# Patient Record
Sex: Male | Born: 1935 | Race: White | Hispanic: No | Marital: Married | Smoking: Former smoker
Health system: Southern US, Community
[De-identification: ages and names within clinical notes are randomized; demographics above are authoritative.]

## PROBLEM LIST (undated history)

## (undated) DIAGNOSIS — N189 Chronic kidney disease, unspecified: Secondary | ICD-10-CM

## (undated) DIAGNOSIS — I10 Essential (primary) hypertension: Secondary | ICD-10-CM

## (undated) DIAGNOSIS — I2510 Atherosclerotic heart disease of native coronary artery without angina pectoris: Secondary | ICD-10-CM

## (undated) DIAGNOSIS — N400 Enlarged prostate without lower urinary tract symptoms: Secondary | ICD-10-CM

## (undated) DIAGNOSIS — E785 Hyperlipidemia, unspecified: Secondary | ICD-10-CM

## (undated) DIAGNOSIS — A35 Other tetanus: Secondary | ICD-10-CM

## (undated) DIAGNOSIS — I739 Peripheral vascular disease, unspecified: Secondary | ICD-10-CM

## (undated) HISTORY — DX: Peripheral vascular disease, unspecified: I73.9

## (undated) HISTORY — PX: CORONARY ANGIOPLASTY: SHX604

## (undated) HISTORY — DX: Benign prostatic hyperplasia without lower urinary tract symptoms: N40.0

## (undated) HISTORY — DX: Atherosclerotic heart disease of native coronary artery without angina pectoris: I25.10

## (undated) HISTORY — PX: CATARACT EXTRACTION: SUR2

## (undated) HISTORY — DX: Chronic kidney disease, unspecified: N18.9

## (undated) HISTORY — PX: PATELLA FRACTURE SURGERY: SHX735

## (undated) HISTORY — DX: Other tetanus: A35

## (undated) HISTORY — DX: Hyperlipidemia, unspecified: E78.5

---

## 2011-10-14 DIAGNOSIS — I10 Essential (primary) hypertension: Secondary | ICD-10-CM | POA: Diagnosis not present

## 2011-10-14 DIAGNOSIS — E1149 Type 2 diabetes mellitus with other diabetic neurological complication: Secondary | ICD-10-CM | POA: Diagnosis not present

## 2011-10-14 DIAGNOSIS — I739 Peripheral vascular disease, unspecified: Secondary | ICD-10-CM | POA: Diagnosis not present

## 2011-10-14 DIAGNOSIS — E1142 Type 2 diabetes mellitus with diabetic polyneuropathy: Secondary | ICD-10-CM | POA: Diagnosis not present

## 2011-10-16 DIAGNOSIS — R0989 Other specified symptoms and signs involving the circulatory and respiratory systems: Secondary | ICD-10-CM | POA: Diagnosis not present

## 2012-01-26 ENCOUNTER — Emergency Department (HOSPITAL_COMMUNITY): Payer: Medicare Other

## 2012-01-26 ENCOUNTER — Ambulatory Visit (INDEPENDENT_AMBULATORY_CARE_PROVIDER_SITE_OTHER): Payer: Medicare Other | Admitting: Emergency Medicine

## 2012-01-26 ENCOUNTER — Encounter (HOSPITAL_COMMUNITY): Payer: Self-pay | Admitting: Nurse Practitioner

## 2012-01-26 ENCOUNTER — Emergency Department (HOSPITAL_COMMUNITY)
Admission: EM | Admit: 2012-01-26 | Discharge: 2012-01-26 | Disposition: A | Discharge status: Home / Self Care | Payer: Medicare Other | Attending: Emergency Medicine | Admitting: Emergency Medicine

## 2012-01-26 VITALS — BP 97/64 | HR 69 | Resp 16 | Ht 72.5 in | Wt 185.0 lb

## 2012-01-26 DIAGNOSIS — I10 Essential (primary) hypertension: Secondary | ICD-10-CM | POA: Insufficient documentation

## 2012-01-26 DIAGNOSIS — S01119A Laceration without foreign body of unspecified eyelid and periocular area, initial encounter: Secondary | ICD-10-CM

## 2012-01-26 DIAGNOSIS — S0993XA Unspecified injury of face, initial encounter: Secondary | ICD-10-CM | POA: Diagnosis not present

## 2012-01-26 DIAGNOSIS — Y92009 Unspecified place in unspecified non-institutional (private) residence as the place of occurrence of the external cause: Secondary | ICD-10-CM | POA: Insufficient documentation

## 2012-01-26 DIAGNOSIS — S199XXA Unspecified injury of neck, initial encounter: Secondary | ICD-10-CM | POA: Diagnosis not present

## 2012-01-26 DIAGNOSIS — S0180XA Unspecified open wound of other part of head, initial encounter: Secondary | ICD-10-CM | POA: Insufficient documentation

## 2012-01-26 DIAGNOSIS — R7301 Impaired fasting glucose: Secondary | ICD-10-CM | POA: Diagnosis not present

## 2012-01-26 DIAGNOSIS — R220 Localized swelling, mass and lump, head: Secondary | ICD-10-CM | POA: Diagnosis not present

## 2012-01-26 DIAGNOSIS — R55 Syncope and collapse: Secondary | ICD-10-CM | POA: Diagnosis not present

## 2012-01-26 DIAGNOSIS — R4182 Altered mental status, unspecified: Secondary | ICD-10-CM | POA: Diagnosis not present

## 2012-01-26 DIAGNOSIS — E119 Type 2 diabetes mellitus without complications: Secondary | ICD-10-CM | POA: Insufficient documentation

## 2012-01-26 DIAGNOSIS — W19XXXA Unspecified fall, initial encounter: Secondary | ICD-10-CM | POA: Insufficient documentation

## 2012-01-26 DIAGNOSIS — IMO0002 Reserved for concepts with insufficient information to code with codable children: Secondary | ICD-10-CM

## 2012-01-26 DIAGNOSIS — E161 Other hypoglycemia: Secondary | ICD-10-CM | POA: Diagnosis not present

## 2012-01-26 DIAGNOSIS — R42 Dizziness and giddiness: Secondary | ICD-10-CM | POA: Diagnosis not present

## 2012-01-26 HISTORY — DX: Essential (primary) hypertension: I10

## 2012-01-26 LAB — COMPREHENSIVE METABOLIC PANEL
Albumin: 3.3 g/dL — ABNORMAL LOW (ref 3.5–5.2)
BUN: 13 mg/dL (ref 6–23)
Chloride: 102 mEq/L (ref 96–112)
Creatinine, Ser: 1.11 mg/dL (ref 0.50–1.35)
GFR calc Af Amer: 65 mL/min — ABNORMAL LOW (ref >90)
Glucose, Bld: 161 mg/dL — ABNORMAL HIGH (ref 70–99)
Total Bilirubin: 0.2 mg/dL — ABNORMAL LOW (ref 0.3–1.2)
Total Protein: 6.4 g/dL (ref 6.0–8.3)

## 2012-01-26 LAB — DIFFERENTIAL
Basophils Relative: 0 % (ref 0–1)
Eosinophils Absolute: 0 10*3/uL (ref 0.0–0.7)
Lymphs Abs: 0.6 10*3/uL — ABNORMAL LOW (ref 0.7–4.0)
Monocytes Absolute: 0.6 10*3/uL (ref 0.1–1.0)
Monocytes Relative: 6 % (ref 3–12)

## 2012-01-26 LAB — URINALYSIS, ROUTINE W REFLEX MICROSCOPIC
Glucose, UA: NEGATIVE mg/dL
Ketones, ur: NEGATIVE mg/dL
Leukocytes, UA: NEGATIVE
Nitrite: NEGATIVE
Protein, ur: NEGATIVE mg/dL
Urobilinogen, UA: 0.2 mg/dL (ref 0.0–1.0)

## 2012-01-26 LAB — POCT I-STAT TROPONIN I: Troponin i, poc: 0 ng/mL (ref 0.00–0.08)

## 2012-01-26 LAB — CBC
HCT: 42.2 % (ref 39.0–52.0)
Hemoglobin: 14.5 g/dL (ref 13.0–17.0)
MCH: 33.6 pg (ref 26.0–34.0)
MCHC: 34.4 g/dL (ref 30.0–36.0)
RBC: 4.32 MIL/uL (ref 4.22–5.81)

## 2012-01-26 LAB — GLUCOSE, POCT (MANUAL RESULT ENTRY): POC Glucose: 172

## 2012-01-26 NOTE — ED Notes (Signed)
Pt undressed, in gown, on monitor, continuous pulse oximetry, blood pressure cuff and oxygen [place] (2L); EKG performed 

## 2012-01-26 NOTE — ED Notes (Signed)
Pt has returned from being off the floor; pt placed back on monitor, continuous pulse oximetry, blood pressure cuff and oxygen [place] (2L); family at bedside; phlebotomist at bedside drawing blood at this time

## 2012-01-26 NOTE — ED Notes (Signed)
Pt. States he became dizzy today and fell on the floor hitting his head. Unsure if loss of consciousness. States he was then nauseous and weak. Currently only complaint is weakness. Denies any pain. Radial and pedal Pulses palpable.

## 2012-01-26 NOTE — Discharge Instructions (Signed)
Have the sutures removed in 3-5 days  Near-Syncope Near-syncope is sudden weakness, dizziness, or feeling like you might pass out (faint). This may occur when getting up after sitting or while standing for a long period of time. Near-syncope can be caused by a drop in blood pressure. This is a common reaction, but it may occur to a greater degree in people taking medicines to control their blood pressure. Fainting often occurs when the blood pressure or pulse is too low to provide enough blood flow to the brain to keep you conscious. Fainting and near-syncope are not usually due to serious medical problems. However, certain people should be more cautious in the event of near-syncope, including elderly patients, patients with diabetes, and patients with a history of heart conditions (especially irregular rhythms).  CAUSES   Drop in blood pressure.   Physical pain.   Dehydration.   Heat exhaustion.   Emotional distress.   Low blood sugar.   Internal bleeding.   Heart and circulatory problems.   Infections.  SYMPTOMS   Dizziness.   Feeling sick to your stomach (nauseous).   Nearly fainting.   Body numbness.   Turning pale.   Tunnel vision.   Weakness.  HOME CARE INSTRUCTIONS   Lie down right away if you start feeling like you might faint. Breathe deeply and steadily. Wait until all the symptoms have passed. Most of these episodes last only a few minutes. You may feel tired for several hours.   Drink enough fluids to keep your urine clear or pale yellow.   If you are taking blood pressure or heart medicine, get up slowly, taking several minutes to sit and then stand. This can reduce dizziness that is caused by a drop in blood pressure.  SEEK IMMEDIATE MEDICAL CARE IF:   You have a severe headache.   Unusual pain develops in the chest, abdomen, or back.   There is bleeding from the mouth or rectum, or you have black or tarry stool.   An irregular heartbeat or a very  rapid pulse develops.   You have repeated fainting or seizure-like jerking during an episode.   You faint when sitting or lying down.   You develop confusion.   You have difficulty walking.   Severe weakness develops.   Vision problems develop.  MAKE SURE YOU:   Understand these instructions.   Will watch your condition.   Will get help right away if you are not doing well or get worse.  Document Released: 09/02/2005 Document Revised: 08/22/2011 Document Reviewed: 10/19/2010 [facility] Patient Information 2012 [place], [place].Laceration Care, Adult A laceration is a cut or lesion that goes through all layers of the skin and into the tissue just beneath the skin. TREATMENT  Some lacerations may not require closure. Some lacerations may not be able to be closed due to an increased risk of infection. It is important to see your caregiver as soon as possible after an injury to minimize the risk of infection and maximize the opportunity for successful closure. If closure is appropriate, pain medicines may be given, if needed. The wound will be cleaned to help prevent infection. Your caregiver will use stitches (sutures), staples, wound glue (adhesive), or skin adhesive strips to repair the laceration. These tools bring the skin edges together to allow for faster healing and a better cosmetic outcome. However, all wounds will heal with a scar. Once the wound has healed, scarring can be minimized by covering the wound with sunscreen during the day  for 1 full year. HOME CARE INSTRUCTIONS  For sutures or staples:  Keep the wound clean and dry.   If you were given a bandage (dressing), you should change it at least once a day. Also, change the dressing if it becomes wet or dirty, or as directed by your caregiver.   Wash the wound with soap and water 2 times a day. Rinse the wound off with water to remove all soap. Pat the wound dry with a clean towel.   After cleaning, apply a thin layer of  the antibiotic ointment as recommended by your caregiver. This will help prevent infection and keep the dressing from sticking.   You may shower as usual after the first 24 hours. Do not soak the wound in water until the sutures are removed.   Only take over-the-counter or prescription medicines for pain, discomfort, or fever as directed by your caregiver.   Get your sutures or staples removed as directed by your caregiver.  For skin adhesive strips:  Keep the wound clean and dry.   Do not get the skin adhesive strips wet. You may bathe carefully, using caution to keep the wound dry.   If the wound gets wet, pat it dry with a clean towel.   Skin adhesive strips will fall off on their own. You may trim the strips as the wound heals. Do not remove skin adhesive strips that are still stuck to the wound. They will fall off in time.  For wound adhesive:  You may briefly wet your wound in the shower or bath. Do not soak or scrub the wound. Do not swim. Avoid periods of heavy perspiration until the skin adhesive has fallen off on its own. After showering or bathing, gently pat the wound dry with a clean towel.   Do not apply liquid medicine, cream medicine, or ointment medicine to your wound while the skin adhesive is in place. This may loosen the film before your wound is healed.   If a dressing is placed over the wound, be careful not to apply tape directly over the skin adhesive. This may cause the adhesive to be pulled off before the wound is healed.   Avoid prolonged exposure to sunlight or tanning lamps while the skin adhesive is in place. Exposure to ultraviolet light in the first year will darken the scar.   The skin adhesive will usually remain in place for 5 to 10 days, then naturally fall off the skin. Do not pick at the adhesive film.  You may need a tetanus shot if:  You cannot remember when you had your last tetanus shot.   You have never had a tetanus shot.  If you get a  tetanus shot, your arm may swell, get red, and feel warm to the touch. This is common and not a problem. If you need a tetanus shot and you choose not to have one, there is a rare chance of getting tetanus. Sickness from tetanus can be serious. SEEK MEDICAL CARE IF:   You have redness, swelling, or increasing pain in the wound.   You see a red line that goes away from the wound.   You have yellowish-white fluid (pus) coming from the wound.   You have a fever.   You notice a bad smell coming from the wound or dressing.   Your wound breaks open before or after sutures have been removed.   You notice something coming out of the wound such as wood or  glass.   Your wound is on your hand or foot and you cannot move a finger or toe.  SEEK IMMEDIATE MEDICAL CARE IF:   Your pain is not controlled with prescribed medicine.   You have severe swelling around the wound causing pain and numbness or a change in color in your arm, hand, leg, or foot.   Your wound splits open and starts bleeding.   You have worsening numbness, weakness, or loss of function of any joint around or beyond the wound.   You develop painful lumps near the wound or on the skin anywhere on your body.  MAKE SURE YOU:   Understand these instructions.   Will watch your condition.   Will get help right away if you are not doing well or get worse.  Document Released: 09/02/2005 Document Revised: 08/22/2011 Document Reviewed: 02/26/2011 [facility] Patient Information 2012 [place], [place].

## 2012-01-26 NOTE — ED Notes (Signed)
Patient transported to X-ray 

## 2012-01-26 NOTE — Progress Notes (Signed)
  Subjective:    Patient ID: Andrew Weaver, male    DOB: [birth year], 78 y.o.   MRN: [id number]  HPI patient presents with an episode of near syncope. Apparently he got up to eat breakfast and had a near syncopal episode in the kitchen. He fell to the ground striking the area above his left. He feels extremely weak and nauseated. He describes some mild substernal discomfort on arrival here the patient was still complaining of nausea. He still continued to feel lightheaded. He is an insulin-dependent diabetic and also hypertensive patient has had stents in his heart and abdominal stent    Review of Systems     Objective:   Physical Exam on exam patient is lying supine on the exam table. There is a small laceration above the left. There are no focal cranial nerve signs. He is alert and oriented he has good strength in upper and lower extremities. Exam revealed an irregular rhythm no murmurs. His lungs were clear with pulse ox of 98  EKG showed what appears to be intermittent sinus intermittent nodal rhythm slow rate not below 50 with one PVC seen      Assessment & Plan:  Patient with an episode of near syncope head trauma with persistent nausea and history of cardiac disease. He was started on O2 . patient placed on monitor EMS called for evaluation in the emergency room

## 2012-01-26 NOTE — ED Notes (Signed)
Per ems: pt from assisted living, had a syncopal episode today falling from standing position landing face first onto floor. Wife drove to urgent care. Urgent care called ems transport for hypotension and bradycardia at rate 58. ems found lac to L eyelid with no active bleeding, pt A&Ox4, VSS en route. Urgent care gave 500 cc bolus. MAE.

## 2012-01-26 NOTE — ED Notes (Signed)
Pt given a Happy meal with a cup of ice water to drink

## 2012-01-26 NOTE — ED Provider Notes (Signed)
History     CSN: [id number]  Arrival date & time 01/26/12  1111   First MD Initiated Contact with Patient 01/26/12 1119      Chief Complaint  Patient presents with  . Loss of Consciousness    (Consider location/radiation/quality/duration/timing/severity/associated sxs/prior treatment) Patient is a 78 y.o. male presenting with syncope. The history is provided by the patient and the spouse.  Loss of Consciousness   patient here after having an unwitnessed syncopal event at home. States she was walking to the kitchen to have some food, took his blood pressure should medication and then had a brief syncopal event. Denied any headaches, chest pain, abdominal pain, dyspnea prior to the event. No postictal period. Complains sharp pain to his face. Denies any neck pain or upper or lower extremity paresthesias. Went to urgent care and was noted to have been hypotensive and bradycardic with a heart rate of 58. Was given saline bolus of a half a liter and transported here. He denies any dizziness or recent black or bloody stools. No prior history of this  Past Medical History  Diagnosis Date  . Diabetes mellitus   . Hypertension     History reviewed. No pertinent past surgical history.  History reviewed. No pertinent family history.  History  Substance Use Topics  . Smoking status: Former Smoker    Quit date: 09/17/1991  . Smokeless tobacco: Never Used  . Alcohol Use: Yes     7      Review of Systems  Cardiovascular: Positive for syncope.  All other systems reviewed and are negative.    Allergies  Review of patient's allergies indicates no known allergies.  Home Medications  No current outpatient prescriptions on file.  BP 125/80  Pulse 64  Temp(Src) 97.5 F (36.4 C) (Oral)  Resp 20  SpO2 99%  Physical Exam  Nursing note and vitals reviewed. Constitutional: He is oriented to person, place, and time. He appears well-developed and well-nourished.  Non-toxic  appearance. No distress.  HENT:  Head: Normocephalic. Head is with abrasion, with contusion and with laceration.  Eyes: Conjunctivae, EOM and lids are normal. Pupils are equal, round, and reactive to light.  Neck: Normal range of motion. Neck supple. No tracheal deviation present. No mass present.  Cardiovascular: Normal rate, regular rhythm and normal heart sounds.  Exam reveals no gallop.   No murmur heard. Pulmonary/Chest: Effort normal and breath sounds normal. No stridor. No respiratory distress. He has no decreased breath sounds. He has no wheezes. He has no rhonchi. He has no rales.  Abdominal: Soft. Normal appearance and bowel sounds are normal. He exhibits no distension. There is no tenderness. There is no rebound and no CVA tenderness.  Musculoskeletal: Normal range of motion. He exhibits no edema and no tenderness.  Neurological: He is alert and oriented to person, place, and time. He has normal strength. No cranial nerve deficit or sensory deficit. GCS eye subscore is 4. GCS verbal subscore is 5. GCS motor subscore is 6.  Skin: Skin is warm and dry. No abrasion and no rash noted.  Psychiatric: He has a normal mood and affect. His speech is normal and behavior is normal.    ED Course  Procedures (including critical care time)   Labs Reviewed  CBC  DIFFERENTIAL  COMPREHENSIVE METABOLIC PANEL  URINALYSIS, ROUTINE W REFLEX MICROSCOPIC  URINE CULTURE   No results found.   No diagnosis found.    MDM  The patient had negative orthostatics x2 here. He  was able to stand on his own. Blood work and x-rays noted. His bradycardia is chronic. Suspect that patient's transient hypotension due 2 his blood pressure medication.. Laceration repaired by my physician assistant. According to the family patient `pt is at his baseline and now stable for d/c         Toy Baker, MD 01/26/12 [phone]

## 2012-01-26 NOTE — ED Notes (Signed)
X-ray tech at bedside to take pt to x-ray

## 2012-01-26 NOTE — ED Provider Notes (Signed)
I performed the laceration repair only for this patient. Please see Dr. Eliot Ford note for complete documentation.  LACERATION REPAIR Performed by: Grant Fontana Authorized by: Grant Fontana Consent: Verbal consent obtained. Risks and benefits: risks, benefits and alternatives were discussed Consent given by: patient Patient identity confirmed: provided demographic data Prepped and Draped in normal sterile fashion Wound explored  Laceration Location: L eyebrow  Laceration Length: 1.5cm  No Foreign Bodies seen or palpated  Anesthesia: local infiltration  Local anesthetic: lidocaine 2% without epinephrine  Anesthetic total: 1 ml  Irrigation method: syringe Amount of cleaning: standard  Skin closure: 6-0 Prolene  Number of sutures: 3  Technique: simple interrupted  Patient tolerance: Patient tolerated the procedure well with no immediate complications.   Grant Fontana, [place] 01/26/12 1523

## 2012-01-28 LAB — URINE CULTURE: Colony Count: NO GROWTH

## 2012-01-28 NOTE — ED Provider Notes (Signed)
Medical screening examination/treatment/procedure(s) were conducted as a shared visit with non-physician practitioner(s) and myself.  I personally evaluated the patient during the encounter  Toy Baker, MD 01/28/12 1512

## 2012-01-29 ENCOUNTER — Encounter: Payer: Self-pay | Admitting: Emergency Medicine

## 2012-01-31 DIAGNOSIS — R55 Syncope and collapse: Secondary | ICD-10-CM | POA: Diagnosis not present

## 2012-01-31 DIAGNOSIS — E7800 Pure hypercholesterolemia, unspecified: Secondary | ICD-10-CM | POA: Diagnosis not present

## 2012-01-31 DIAGNOSIS — I2510 Atherosclerotic heart disease of native coronary artery without angina pectoris: Secondary | ICD-10-CM | POA: Diagnosis not present

## 2012-01-31 DIAGNOSIS — I10 Essential (primary) hypertension: Secondary | ICD-10-CM | POA: Diagnosis not present

## 2012-03-24 DIAGNOSIS — Z1331 Encounter for screening for depression: Secondary | ICD-10-CM | POA: Diagnosis not present

## 2012-03-24 DIAGNOSIS — Z0000 Encounter for general adult medical examination without abnormal findings: Secondary | ICD-10-CM | POA: Diagnosis not present

## 2012-03-24 DIAGNOSIS — I10 Essential (primary) hypertension: Secondary | ICD-10-CM | POA: Diagnosis not present

## 2012-03-24 DIAGNOSIS — Z79899 Other long term (current) drug therapy: Secondary | ICD-10-CM | POA: Diagnosis not present

## 2012-03-24 DIAGNOSIS — E1149 Type 2 diabetes mellitus with other diabetic neurological complication: Secondary | ICD-10-CM | POA: Diagnosis not present

## 2012-03-24 DIAGNOSIS — E7800 Pure hypercholesterolemia, unspecified: Secondary | ICD-10-CM | POA: Diagnosis not present

## 2012-03-24 DIAGNOSIS — E1142 Type 2 diabetes mellitus with diabetic polyneuropathy: Secondary | ICD-10-CM | POA: Diagnosis not present

## 2012-03-24 DIAGNOSIS — K5900 Constipation, unspecified: Secondary | ICD-10-CM | POA: Diagnosis not present

## 2012-06-17 DIAGNOSIS — Z23 Encounter for immunization: Secondary | ICD-10-CM | POA: Diagnosis not present

## 2012-08-10 DIAGNOSIS — I10 Essential (primary) hypertension: Secondary | ICD-10-CM | POA: Diagnosis not present

## 2012-08-10 DIAGNOSIS — R0989 Other specified symptoms and signs involving the circulatory and respiratory systems: Secondary | ICD-10-CM | POA: Diagnosis not present

## 2012-08-10 DIAGNOSIS — I2510 Atherosclerotic heart disease of native coronary artery without angina pectoris: Secondary | ICD-10-CM | POA: Diagnosis not present

## 2012-08-10 DIAGNOSIS — E7800 Pure hypercholesterolemia, unspecified: Secondary | ICD-10-CM | POA: Diagnosis not present

## 2012-08-17 DIAGNOSIS — D485 Neoplasm of uncertain behavior of skin: Secondary | ICD-10-CM | POA: Diagnosis not present

## 2012-08-17 DIAGNOSIS — L570 Actinic keratosis: Secondary | ICD-10-CM | POA: Diagnosis not present

## 2012-09-11 DIAGNOSIS — R0989 Other specified symptoms and signs involving the circulatory and respiratory systems: Secondary | ICD-10-CM | POA: Diagnosis not present

## 2012-09-25 DIAGNOSIS — E7800 Pure hypercholesterolemia, unspecified: Secondary | ICD-10-CM | POA: Diagnosis not present

## 2012-09-25 DIAGNOSIS — E1149 Type 2 diabetes mellitus with other diabetic neurological complication: Secondary | ICD-10-CM | POA: Diagnosis not present

## 2012-09-25 DIAGNOSIS — I10 Essential (primary) hypertension: Secondary | ICD-10-CM | POA: Diagnosis not present

## 2012-09-25 DIAGNOSIS — Z79899 Other long term (current) drug therapy: Secondary | ICD-10-CM | POA: Diagnosis not present

## 2012-09-25 DIAGNOSIS — E782 Mixed hyperlipidemia: Secondary | ICD-10-CM | POA: Diagnosis not present

## 2012-09-25 DIAGNOSIS — R05 Cough: Secondary | ICD-10-CM | POA: Diagnosis not present

## 2012-09-25 DIAGNOSIS — E1142 Type 2 diabetes mellitus with diabetic polyneuropathy: Secondary | ICD-10-CM | POA: Diagnosis not present

## 2012-09-30 DIAGNOSIS — Z961 Presence of intraocular lens: Secondary | ICD-10-CM | POA: Diagnosis not present

## 2013-03-26 DIAGNOSIS — Z79899 Other long term (current) drug therapy: Secondary | ICD-10-CM | POA: Diagnosis not present

## 2013-03-26 DIAGNOSIS — Z0000 Encounter for general adult medical examination without abnormal findings: Secondary | ICD-10-CM | POA: Diagnosis not present

## 2013-03-26 DIAGNOSIS — E1149 Type 2 diabetes mellitus with other diabetic neurological complication: Secondary | ICD-10-CM | POA: Diagnosis not present

## 2013-03-26 DIAGNOSIS — Z1331 Encounter for screening for depression: Secondary | ICD-10-CM | POA: Diagnosis not present

## 2013-03-26 DIAGNOSIS — I10 Essential (primary) hypertension: Secondary | ICD-10-CM | POA: Diagnosis not present

## 2013-03-26 DIAGNOSIS — E1142 Type 2 diabetes mellitus with diabetic polyneuropathy: Secondary | ICD-10-CM | POA: Diagnosis not present

## 2013-03-26 DIAGNOSIS — E7800 Pure hypercholesterolemia, unspecified: Secondary | ICD-10-CM | POA: Diagnosis not present

## 2013-04-22 DIAGNOSIS — L570 Actinic keratosis: Secondary | ICD-10-CM | POA: Diagnosis not present

## 2013-04-22 DIAGNOSIS — L821 Other seborrheic keratosis: Secondary | ICD-10-CM | POA: Diagnosis not present

## 2013-04-29 DIAGNOSIS — L570 Actinic keratosis: Secondary | ICD-10-CM | POA: Diagnosis not present

## 2013-05-07 DIAGNOSIS — I10 Essential (primary) hypertension: Secondary | ICD-10-CM | POA: Diagnosis not present

## 2013-05-07 DIAGNOSIS — G459 Transient cerebral ischemic attack, unspecified: Secondary | ICD-10-CM | POA: Diagnosis not present

## 2013-05-10 DIAGNOSIS — I10 Essential (primary) hypertension: Secondary | ICD-10-CM | POA: Diagnosis not present

## 2013-05-10 DIAGNOSIS — I2510 Atherosclerotic heart disease of native coronary artery without angina pectoris: Secondary | ICD-10-CM | POA: Diagnosis not present

## 2013-05-10 DIAGNOSIS — G459 Transient cerebral ischemic attack, unspecified: Secondary | ICD-10-CM | POA: Diagnosis not present

## 2013-05-10 DIAGNOSIS — E7800 Pure hypercholesterolemia, unspecified: Secondary | ICD-10-CM | POA: Diagnosis not present

## 2013-07-01 DIAGNOSIS — L821 Other seborrheic keratosis: Secondary | ICD-10-CM | POA: Diagnosis not present

## 2013-07-18 ENCOUNTER — Ambulatory Visit (INDEPENDENT_AMBULATORY_CARE_PROVIDER_SITE_OTHER): Payer: Medicare Other | Admitting: Family Medicine

## 2013-07-18 ENCOUNTER — Ambulatory Visit: Payer: Medicare Other

## 2013-07-18 VITALS — BP 128/62 | HR 89 | Temp 97.5°F | Resp 18 | Ht 72.0 in | Wt 182.0 lb

## 2013-07-18 DIAGNOSIS — J209 Acute bronchitis, unspecified: Secondary | ICD-10-CM

## 2013-07-18 LAB — POCT CBC
HCT, POC: 43.3 % — AB (ref 43.5–53.7)
Hemoglobin: 13.7 g/dL — AB (ref 14.1–18.1)
Lymph, poc: 1.2 (ref 0.6–3.4)
MCH, POC: 32.7 pg — AB (ref 27–31.2)
MCHC: 31.6 g/dL — AB (ref 31.8–35.4)
MCV: 103.4 fL — AB (ref 80–97)
POC MID %: 6.9 %M (ref 0–12)
RBC: 4.19 M/uL — AB (ref 4.69–6.13)
WBC: 6.8 10*3/uL (ref 4.6–10.2)

## 2013-07-18 MED ORDER — AZITHROMYCIN 250 MG PO TABS: ORAL_TABLET | ORAL | Status: DC

## 2013-07-18 MED ORDER — GUAIFENESIN-CODEINE 100-10 MG/5ML PO SOLN: 5.0000 mL | Freq: Four times a day (QID) | ORAL | Status: DC | PRN

## 2013-07-18 NOTE — Patient Instructions (Signed)

## 2013-07-18 NOTE — Progress Notes (Signed)
This chart was scribed for Andrew Sorenson, MD by Ardelia Mems, Scribe. This patient was seen in room 8 and the patient's care was started at 2:32 PM.  Subjective:    Patient ID: Andrew Weaver, male    DOB: 03-Mar-1919, 79 y.o.   MRN: [id number]  Chief Complaint  Patient presents with  . Bronchitis    * 4 days  . Fever    101 in the evenings   HPI  HPI Comments: Andrew Weaver is a 79 y.o. male who presents to [facility] complaining of a cough for the past 4 days. He states that he has not been able to produce any sputum with coughing, and states that his chest feels congested. He also reports an associated fever of 101 F present intermittently over the past 4 days. He also states that he has not been able to sleep much the past 4 nights, due to his symptoms. He states that he has taken Mucinex without relief of symptoms. He has a history of DM and states that his blood sugars have not been significantly elevated at home, and have not went over the 120s.  He denies headaches, ear pain, chest pain, SOB or wheezing. Denies nasal congestion. Had a little sore throat initially but now mainly has laryngitis. States he caught this illness from his wife who will be in tomorrow for recheck.   Past Medical History  Diagnosis Date  . Diabetes mellitus   . Hypertension    Current Outpatient Prescriptions on File Prior to Visit  Medication Sig Dispense Refill  . insulin glargine (LANTUS) 100 UNIT/ML injection Inject 22 Units into the skin daily.      . insulin regular (NOVOLIN R,HUMULIN R) 100 units/mL injection Inject 5-10 Units into the skin 3 (three) times daily before meals. 5 units if blood sugar under 150, 10 units if over 200 - estimates in between 150 and 200      . rosuvastatin (CRESTOR) 20 MG tablet Take 20 mg by mouth at bedtime.      Marland Kitchen FOLIC ACID PO Take 1 tablet by mouth daily.       No current facility-administered medications on file prior to visit.   No Known  Allergies   Review of Systems  Constitutional: Positive for fever.  HENT: Positive for congestion. Negative for ear pain.   Respiratory: Positive for cough. Negative for shortness of breath and wheezing.   Cardiovascular: Negative for chest pain.  Neurological: Negative for headaches.  Psychiatric/Behavioral: Positive for sleep disturbance.      BP 128/62  Pulse 89  Temp(Src) 97.5 F (36.4 C) (Oral)  Resp 18  Ht 6' (1.829 m)  Wt 182 lb (82.555 kg)  BMI 24.68 kg/m2  SpO2 96% Objective:   Physical Exam  Nursing note and vitals reviewed. Constitutional: He is oriented to person, place, and time. He appears well-developed and well-nourished. No distress.  HENT:  Head: Normocephalic and atraumatic.  Right Ear: Tympanic membrane is injected and retracted. A middle ear effusion is present.  Left Ear: Tympanic membrane is injected and retracted. A middle ear effusion is present.  Nose: No mucosal edema or rhinorrhea.  Mouth/Throat: Uvula is midline and mucous membranes are normal. Posterior oropharyngeal erythema present. No oropharyngeal exudate or posterior oropharyngeal edema.  Eyes: EOM are normal.  Neck: Neck supple. No tracheal deviation present.  Cardiovascular: Normal rate.   Pulmonary/Chest: Effort normal. No respiratory distress.  Musculoskeletal: Normal range of motion.  Lymphadenopathy:  Head (right side): Tonsillar adenopathy present.       Head (left side): Tonsillar adenopathy present.  Neurological: He is alert and oriented to person, place, and time.  Skin: Skin is warm and dry.  Psychiatric: He has a normal mood and affect. His behavior is normal.    Results for orders placed in visit on 07/18/13  POCT CBC      Result Value Range   WBC 6.8  4.6 - 10.2 K/uL   Lymph, poc 1.2  0.6 - 3.4   POC LYMPH PERCENT 17.3  10 - 50 %L   MID (cbc) 0.5  0 - 0.9   POC MID % 6.9  0 - 12 %M   POC Granulocyte 5.2  2 - 6.9   Granulocyte percent 75.8  37 - 80 %G   RBC  4.19 (*) 4.69 - 6.13 M/uL   Hemoglobin 13.7 (*) 14.1 - 18.1 g/dL   HCT, POC 16.1 (*) 09.6 - 53.7 %   MCV 103.4 (*) 80 - 97 fL   MCH, POC 32.7 (*) 27 - 31.2 pg   MCHC 31.6 (*) 31.8 - 35.4 g/dL   RDW, POC 04.5     Platelet Count, POC 189  142 - 424 K/uL   MPV 11.1  0 - 99.8 fL    Primary X-Ray Reading by Dr. Clelia Croft: No acute abnormality noted EKG: NSR, no ischemic changes; no acute abnormality    Assessment & Plan:   Acute bronchitis - Plan: POCT CBC, DG Chest 2 View, EKG 12-Lead  Meds ordered this encounter  Medications  . losartan-hydrochlorothiazide (HYZAAR) 100-25 MG per tablet    Sig: Take 1 tablet by mouth daily.  . ticlopidine (TICLID) 250 MG tablet    Sig: Take 250 mg by mouth daily.  Marland Kitchen guaiFENesin-codeine 100-10 MG/5ML syrup    Sig: Take 5 mLs by mouth 4 (four) times daily as needed for cough.    Dispense:  180 mL    Refill:  0  . azithromycin (ZITHROMAX) 250 MG tablet    Sig: Take 2 tabs PO x 1 dose, then 1 tab PO QD x 4 days    Dispense:  6 tablet    Refill:  0    I personally performed the services described in this documentation, which was scribed in my presence. The recorded information has been reviewed and considered, and addended by me as needed.  Andrew Sorenson, MD MPH

## 2013-07-22 DIAGNOSIS — J40 Bronchitis, not specified as acute or chronic: Secondary | ICD-10-CM | POA: Diagnosis not present

## 2013-08-27 DIAGNOSIS — E1149 Type 2 diabetes mellitus with other diabetic neurological complication: Secondary | ICD-10-CM | POA: Diagnosis not present

## 2013-08-27 DIAGNOSIS — E1142 Type 2 diabetes mellitus with diabetic polyneuropathy: Secondary | ICD-10-CM | POA: Diagnosis not present

## 2013-08-27 DIAGNOSIS — I10 Essential (primary) hypertension: Secondary | ICD-10-CM | POA: Diagnosis not present

## 2013-08-27 DIAGNOSIS — K5900 Constipation, unspecified: Secondary | ICD-10-CM | POA: Diagnosis not present

## 2013-09-01 DIAGNOSIS — E119 Type 2 diabetes mellitus without complications: Secondary | ICD-10-CM | POA: Diagnosis not present

## 2013-09-01 DIAGNOSIS — Z961 Presence of intraocular lens: Secondary | ICD-10-CM | POA: Diagnosis not present

## 2013-09-01 DIAGNOSIS — H52209 Unspecified astigmatism, unspecified eye: Secondary | ICD-10-CM | POA: Diagnosis not present

## 2013-09-01 DIAGNOSIS — H524 Presbyopia: Secondary | ICD-10-CM | POA: Diagnosis not present

## 2013-11-06 DIAGNOSIS — I10 Essential (primary) hypertension: Secondary | ICD-10-CM | POA: Insufficient documentation

## 2013-11-10 ENCOUNTER — Encounter (INDEPENDENT_AMBULATORY_CARE_PROVIDER_SITE_OTHER): Payer: Self-pay

## 2013-11-10 ENCOUNTER — Encounter: Payer: Self-pay | Admitting: Interventional Cardiology

## 2013-11-10 ENCOUNTER — Encounter: Payer: Self-pay | Admitting: Cardiology

## 2013-11-10 ENCOUNTER — Ambulatory Visit (INDEPENDENT_AMBULATORY_CARE_PROVIDER_SITE_OTHER): Payer: Medicare Other | Admitting: Interventional Cardiology

## 2013-11-10 VITALS — BP 132/56 | HR 58 | Ht 72.0 in | Wt 184.0 lb

## 2013-11-10 DIAGNOSIS — I10 Essential (primary) hypertension: Secondary | ICD-10-CM | POA: Diagnosis not present

## 2013-11-10 DIAGNOSIS — E1149 Type 2 diabetes mellitus with other diabetic neurological complication: Secondary | ICD-10-CM | POA: Insufficient documentation

## 2013-11-10 DIAGNOSIS — E782 Mixed hyperlipidemia: Secondary | ICD-10-CM

## 2013-11-10 DIAGNOSIS — I2510 Atherosclerotic heart disease of native coronary artery without angina pectoris: Secondary | ICD-10-CM

## 2013-11-10 DIAGNOSIS — E1142 Type 2 diabetes mellitus with diabetic polyneuropathy: Secondary | ICD-10-CM

## 2013-11-10 NOTE — Progress Notes (Signed)
Patient ID: Andrew Weaver, male   DOB: [birth year], 80 y.o.   MRN: [id number]    [place], [address] [place], [place]  [address] Phone: [phone] Fax:  [phone]  Date:  11/10/2013   ID:  Andrew Weaver, DOB [birth year], MRN [id number]  PCP:  Robyn Haber, MD      History of Present Illness: Andrew Weaver is a 80 y.o. male who has 2 stents in the LAD several years ago, out of town. He feels that his balance is not good. He has not been walking. He feels fatigued generally. IN early 2013, he had a syncopal episode and had a cut over his left eye requiring stitches. He had an episode of slurred speech and was started on aspirin. His losartan was decreased in half due to dizziness. BP was controlled at home. He had taken one dose of hydrocodone cough syrup. The day after, he passed out. He has not had any further syncopal spells. CAD/ASCVD:  BP has been controlled. No systolics below 485. Denies : Chest pain.  Diaphoresis.  Dizziness.  Dyspnea on exertion.  Leg edema.  Nitroglycerin.  Palpitations.  Paroxysmal nocturnal dyspnea.  Syncope none recently.     Wt Readings from Last 3 Encounters:  11/10/13 184 lb (83.462 kg)  07/18/13 182 lb (82.555 kg)  01/26/12 185 lb (83.915 kg)     Past Medical History  Diagnosis Date  . Diabetes mellitus   . Hypertension   . Hyperlipidemia     goal LDL less than 70  . CAD (coronary artery disease)   . PVD (peripheral vascular disease)   . BPH (benign prostatic hypertrophy)   . Chronic kidney disease     borderline stage II/stage III    Current Outpatient Prescriptions  Medication Sig Dispense Refill  . clopidogrel (PLAVIX) 75 MG tablet Take 75 mg by mouth once.       . Fluticasone-Salmeterol (ADVAIR DISKUS) 500-50 MCG/DOSE AEPB Inhale 1 puff into the lungs 2 (two) times daily.      Marland Kitchen FOLIC ACID PO Take 1 tablet by mouth daily.      Marland Kitchen guaiFENesin-codeine 100-10 MG/5ML syrup Take 5 mLs by mouth 4 (four) times daily  as needed for cough.  180 mL  0  . insulin glargine (LANTUS) 100 UNIT/ML injection Inject 22 Units into the skin daily.      . insulin regular (NOVOLIN R,HUMULIN R) 100 units/mL injection Inject 5-10 Units into the skin 3 (three) times daily before meals. 5 units if blood sugar under 150, 10 units if over 200 - estimates in between 150 and 200      . losartan-hydrochlorothiazide (HYZAAR) 100-25 MG per tablet Take 1 tablet by mouth daily.      . mirtazapine (REMERON) 15 MG tablet       . PREVIDENT 5000 BOOSTER PLUS 1.1 % PSTE       . rosuvastatin (CRESTOR) 20 MG tablet Take 10 mg by mouth at bedtime.       . tamsulosin (FLOMAX) 0.4 MG CAPS capsule Take 0.4 mg by mouth daily.        No current facility-administered medications for this visit.    Allergies:    Allergies  Allergen Reactions  . Lipitor [Atorvastatin]     fatigue    Social History:  The patient  reports that he quit smoking about 22 years ago. He has never used smokeless tobacco. He reports that he drinks alcohol. He reports that he does not use  illicit drugs.   Family History:  The patient's family history includes Cancer in his father; Heart disease in his father, mother, and sister.   ROS:  Please see the history of present illness.  No nausea, vomiting.  No fevers, chills.  No focal weakness.  No dysuria.    All other systems reviewed and negative.   PHYSICAL EXAM: VS:  BP 132/56  Pulse 58  Ht 6' (1.829 m)  Wt 184 lb (83.462 kg)  BMI 24.95 kg/m2 Well nourished, well developed, in no acute distress HEENT: normal Neck: no JVD, no carotid bruits Cardiac:  normal S1, S2; RRR;  Lungs:  clear to auscultation bilaterally, no wheezing, rhonchi or rales Abd: soft, nontender, no hepatomegaly Ext: no edema Skin: warm and dry Neuro:   no focal abnormalities noted    ASSESSMENT AND PLAN:  CAD  Continue Clopidogrel Bisulfate Tablet, 75 MG, 1 tablet, Orally, Once a day, 30 day(s), 30, Refills 11 Notes: No angina. No  aspirin for many years. Not allergic to aspirin, he was just never taking it.  Diet is not great.  He outs out 4 days/week, due to the food being bad at [facility], in his opinion.    2. Hypercholesterolemia  Continue Crestor Tablet, 20 MG, 1 tablet, Orally, Once a day Notes: LDL 58 in 09/2012. TG elevated to 216 in the past. Control diet. He does not enjoy the food at [facility]. Lipids reviewed.   3. Hypertension  Continue Losartan Potassium-HCTZ Tablet, 100-25 MG, half tablet, Orally, Once a day IMAGING: EKG    Overton,Shana 05/10/2013 10:01:33 AM > Shandria Clinch,JAY 05/10/2013 10:19:23 AM > NSR, ST-T wave changes   Notes: Controlled. No elevated readings even on half tab of losartan/HCTZ. If BP increases, would have to go back to full tablet daily. Target BP < 140/90. .    4. TIA  Notes: He is not sure if his speech was really slurred. Carotid DOppler in 12/13 was reviewed. No significant carotid disease. No bleeding problems since adding aspirin back.   No further issues like the prior event noticed by his wife over a year.     5. Others  Notes: No syncope since decrease in BP med.    Preventive Medicine  Adult topics discussed:  Diet: healthy diet, low calorie, low fat.  Exercise: 5 days a week, at least 30 minutes of aerobic exercise, preferably on the exercise bike.     Signed, Mina Marble, MD, [facility] 11/10/2013 10:49 AM

## 2013-11-10 NOTE — Patient Instructions (Signed)
Your physician recommends that you continue on your current medications as directed. Please refer to the Current Medication list given to you today.  Your physician wants you to follow-up in: 6 months with Dr. Varanasi.  You will receive a reminder letter in the mail two months in advance. If you don't receive a letter, please call our office to schedule the follow-up appointment.  

## 2013-11-14 DIAGNOSIS — Z87891 Personal history of nicotine dependence: Secondary | ICD-10-CM | POA: Diagnosis not present

## 2013-11-14 DIAGNOSIS — E119 Type 2 diabetes mellitus without complications: Secondary | ICD-10-CM | POA: Diagnosis present

## 2013-11-14 DIAGNOSIS — N12 Tubulo-interstitial nephritis, not specified as acute or chronic: Secondary | ICD-10-CM | POA: Diagnosis present

## 2013-11-14 DIAGNOSIS — Z79899 Other long term (current) drug therapy: Secondary | ICD-10-CM | POA: Diagnosis not present

## 2013-11-14 DIAGNOSIS — I951 Orthostatic hypotension: Secondary | ICD-10-CM | POA: Diagnosis present

## 2013-11-14 DIAGNOSIS — B961 Klebsiella pneumoniae [K. pneumoniae] as the cause of diseases classified elsewhere: Secondary | ICD-10-CM | POA: Diagnosis present

## 2013-11-14 DIAGNOSIS — E039 Hypothyroidism, unspecified: Secondary | ICD-10-CM | POA: Diagnosis present

## 2013-11-14 DIAGNOSIS — G929 Unspecified toxic encephalopathy: Secondary | ICD-10-CM | POA: Diagnosis present

## 2013-11-14 DIAGNOSIS — R42 Dizziness and giddiness: Secondary | ICD-10-CM | POA: Diagnosis not present

## 2013-11-14 DIAGNOSIS — E860 Dehydration: Secondary | ICD-10-CM | POA: Diagnosis present

## 2013-11-16 ENCOUNTER — Telehealth: Payer: Self-pay | Admitting: Interventional Cardiology

## 2013-11-16 NOTE — Telephone Encounter (Signed)
New message    In the office until 1 pm today back on Thursday    Need an manual order / verbal order / fax    nirtro tablet 0.4 mg  Per tablet  - 25 pills.

## 2013-11-16 NOTE — Telephone Encounter (Signed)
Nitro not on med list. Ok to refill?

## 2013-11-17 NOTE — Telephone Encounter (Signed)
Ok to refill 

## 2013-11-18 MED ORDER — NITROGLYCERIN 0.4 MG SL SUBL: 0.4000 mg | SUBLINGUAL_TABLET | SUBLINGUAL | Status: DC | PRN

## 2013-11-18 NOTE — Telephone Encounter (Signed)
Faxed to [facility].

## 2013-11-19 DIAGNOSIS — K5732 Diverticulitis of large intestine without perforation or abscess without bleeding: Secondary | ICD-10-CM | POA: Diagnosis not present

## 2013-11-29 DIAGNOSIS — R159 Full incontinence of feces: Secondary | ICD-10-CM | POA: Diagnosis not present

## 2013-11-29 DIAGNOSIS — R197 Diarrhea, unspecified: Secondary | ICD-10-CM | POA: Diagnosis not present

## 2014-03-29 DIAGNOSIS — Z1331 Encounter for screening for depression: Secondary | ICD-10-CM | POA: Diagnosis not present

## 2014-03-29 DIAGNOSIS — Z0000 Encounter for general adult medical examination without abnormal findings: Secondary | ICD-10-CM | POA: Diagnosis not present

## 2014-03-29 DIAGNOSIS — K5900 Constipation, unspecified: Secondary | ICD-10-CM | POA: Diagnosis not present

## 2014-03-29 DIAGNOSIS — E1142 Type 2 diabetes mellitus with diabetic polyneuropathy: Secondary | ICD-10-CM | POA: Diagnosis not present

## 2014-03-29 DIAGNOSIS — E7800 Pure hypercholesterolemia, unspecified: Secondary | ICD-10-CM | POA: Diagnosis not present

## 2014-03-29 DIAGNOSIS — Z79899 Other long term (current) drug therapy: Secondary | ICD-10-CM | POA: Diagnosis not present

## 2014-03-29 DIAGNOSIS — E1149 Type 2 diabetes mellitus with other diabetic neurological complication: Secondary | ICD-10-CM | POA: Diagnosis not present

## 2014-03-29 DIAGNOSIS — I10 Essential (primary) hypertension: Secondary | ICD-10-CM | POA: Diagnosis not present

## 2014-05-27 ENCOUNTER — Ambulatory Visit (INDEPENDENT_AMBULATORY_CARE_PROVIDER_SITE_OTHER): Payer: Medicare Other | Admitting: Interventional Cardiology

## 2014-05-27 ENCOUNTER — Encounter: Payer: Self-pay | Admitting: Interventional Cardiology

## 2014-05-27 VITALS — BP 112/54 | HR 64 | Ht 73.0 in | Wt 180.8 lb

## 2014-05-27 DIAGNOSIS — I10 Essential (primary) hypertension: Secondary | ICD-10-CM

## 2014-05-27 DIAGNOSIS — E782 Mixed hyperlipidemia: Secondary | ICD-10-CM | POA: Diagnosis not present

## 2014-05-27 DIAGNOSIS — I2510 Atherosclerotic heart disease of native coronary artery without angina pectoris: Secondary | ICD-10-CM | POA: Diagnosis not present

## 2014-05-27 MED ORDER — ASPIRIN 81 MG PO TABS: 81.0000 mg | ORAL_TABLET | Freq: Every day | ORAL | Status: DC

## 2014-05-27 NOTE — Patient Instructions (Signed)
Your physician recommends that you continue on your current medications as directed. Please refer to the Current Medication list given to you today.  Your physician wants you to follow-up in: 6 months with Dr. Varanasi.  You will receive a reminder letter in the mail two months in advance. If you don't receive a letter, please call our office to schedule the follow-up appointment.  

## 2014-05-27 NOTE — Progress Notes (Signed)
Patient ID: Andrew Weaver, male   DOB: [birth year], 80 y.o.   MRN: [id number] Patient ID: Andrew Weaver, male   DOB: [birth year], 80 y.o.   MRN: [id number]    [place], [place], [place]  [address] Phone: [phone] Fax:  [phone]  Date:  05/27/2014   ID:  Andrew Weaver, DOB [birth year], MRN [id number]  PCP:  Robyn Haber, MD      History of Present Illness: Andrew Weaver is a 80 y.o. male who has 2 stents in the LAD several years ago, out of town in [place]. He feels that his balance is not good. He has not been walking. He feels fatigued generally. IN early 2013, he had a syncopal episode and had a cut over his left eye requiring stitches. He had an episode of slurred speech and was started on aspirin. His losartan was decreased in half due to dizziness. BP was controlled at home. He had taken one dose of hydrocodone cough syrup. The day after, he passed out. He has not had any further syncopal spells. CAD/ASCVD:  BP has been controlled. No systolics below 973. Denies : Chest pain.  Diaphoresis.  Dizziness.  Dyspnea on exertion.  Leg edema.  Nitroglycerin.  Palpitations.  Paroxysmal nocturnal dyspnea.  Syncope none recently.   BP readings in he 532-992 systolic range.  Bruises with aspirin and clopidogrel combination.  Wt Readings from Last 3 Encounters:  05/27/14 180 lb 12.8 oz (82.01 kg)  11/10/13 184 lb (83.462 kg)  07/18/13 182 lb (82.555 kg)     Past Medical History  Diagnosis Date  . Diabetes mellitus   . Hypertension   . Hyperlipidemia     goal LDL less than 70  . CAD (coronary artery disease)   . PVD (peripheral vascular disease)   . BPH (benign prostatic hypertrophy)   . Chronic kidney disease     borderline stage II/stage III    Current Outpatient Prescriptions  Medication Sig Dispense Refill  . clopidogrel (PLAVIX) 75 MG tablet Take 75 mg by mouth once.       . Fluticasone-Salmeterol (ADVAIR DISKUS) 500-50 MCG/DOSE  AEPB Inhale 1 puff into the lungs 2 (two) times daily.      Marland Kitchen FOLIC ACID PO Take 1 tablet by mouth daily.      Marland Kitchen guaiFENesin-codeine 100-10 MG/5ML syrup Take 5 mLs by mouth 4 (four) times daily as needed for cough.  180 mL  0  . insulin glargine (LANTUS) 100 UNIT/ML injection Inject 22 Units into the skin daily.      . insulin regular (NOVOLIN R,HUMULIN R) 100 units/mL injection Inject 5-10 Units into the skin 3 (three) times daily before meals. 5 units if blood sugar under 150, 10 units if over 200 - estimates in between 150 and 200      . losartan-hydrochlorothiazide (HYZAAR) 100-25 MG per tablet Take 1 tablet by mouth daily.      . mirtazapine (REMERON) 15 MG tablet       . nitroGLYCERIN (NITROSTAT) 0.4 MG SL tablet Place 1 tablet (0.4 mg total) under the tongue every 5 (five) minutes as needed for chest pain.  25 tablet  5  . PREVIDENT 5000 BOOSTER PLUS 1.1 % PSTE       . rosuvastatin (CRESTOR) 20 MG tablet Take 10 mg by mouth at bedtime.       . tamsulosin (FLOMAX) 0.4 MG CAPS capsule Take 0.4 mg by mouth daily.        No  current facility-administered medications for this visit.    Allergies:    Allergies  Allergen Reactions  . Lipitor [Atorvastatin]     fatigue    Social History:  The patient  reports that he quit smoking about 22 years ago. He has never used smokeless tobacco. He reports that he drinks alcohol. He reports that he does not use illicit drugs.   Family History:  The patient's family history includes Cancer in his father; Heart disease in his father, mother, and sister.   ROS:  Please see the history of present illness.  No nausea, vomiting.  No fevers, chills.  No focal weakness.  No dysuria.    All other systems reviewed and negative.   PHYSICAL EXAM: VS:  BP 112/54  Pulse 64  Ht 6\' 1"  (1.854 m)  Wt 180 lb 12.8 oz (82.01 kg)  BMI 23.86 kg/m2  SpO2 98% Well nourished, well developed, in no acute distress HEENT: normal Neck: no JVD, no carotid bruits Cardiac:   normal S1, S2; RRR;  Lungs:  clear to auscultation bilaterally, no wheezing, rhonchi or rales Abd: soft, nontender, no hepatomegaly Ext: no edema Skin: warm and dry Neuro:   no focal abnormalities noted    ASSESSMENT AND PLAN:  CAD  Continue Clopidogrel Bisulfate Tablet, 75 MG, 1 tablet, Orally, Once a day, 30 day(s), 30, Refills 11 Notes: No angina.  Diet is not great.  He outs out 4 days/week, due to the food being bad at [facility], in his opinion.    2. Hypercholesterolemia  Continue Crestor Tablet, 20 MG, 1 tablet, Orally, Once a day Notes: LDL 58 in 09/2012. TG elevated to 216 in the past. Control diet. He does not enjoy the food at [facility]. Lipids reviewed.  7/15: LDL 65. Well controlled.     3. Hypertension  Continue Losartan Potassium-HCTZ Tablet, 50-12.5 MG, half tablet, Orally, Once a day IMAGING: EKG    Overton,Shana 05/10/2013 10:01:33 AM > Auron Tadros,JAY 05/10/2013 10:19:23 AM > NSR, ST-T wave changes   Notes: Controlled. No elevated readings even on half tab of losartan/HCTZ. If BP increases, would have to go back to full tablet daily. Target BP < 140/90. .    4. TIA  Notes: He is not sure if his speech was really slurred. Carotid DOppler in 12/13 was reviewed. No significant carotid disease. No bleeding problems since adding aspirin back except bruising.   No further issues like the prior event noticed.   5. Others  Notes: No syncope since decrease in BP med.    Preventive Medicine  Adult topics discussed:  Diet: healthy diet, low calorie, low fat.  Exercise: 5 days a week, at least 30 minutes of aerobic exercise, preferably on the exercise bike.     Signed, Mina Marble, MD, [facility] 05/27/2014 11:41 AM

## 2014-06-06 DIAGNOSIS — Z23 Encounter for immunization: Secondary | ICD-10-CM | POA: Diagnosis not present

## 2014-07-11 DIAGNOSIS — H52202 Unspecified astigmatism, left eye: Secondary | ICD-10-CM | POA: Diagnosis not present

## 2014-07-11 DIAGNOSIS — Z961 Presence of intraocular lens: Secondary | ICD-10-CM | POA: Diagnosis not present

## 2014-07-11 DIAGNOSIS — H532 Diplopia: Secondary | ICD-10-CM | POA: Diagnosis not present

## 2014-07-15 DIAGNOSIS — H532 Diplopia: Secondary | ICD-10-CM | POA: Diagnosis not present

## 2014-07-15 DIAGNOSIS — H1132 Conjunctival hemorrhage, left eye: Secondary | ICD-10-CM | POA: Diagnosis not present

## 2014-08-30 ENCOUNTER — Telehealth: Payer: Self-pay | Admitting: Family Medicine

## 2014-08-30 NOTE — Telephone Encounter (Signed)
Flu shot was given at PCP on 05/17/14

## 2014-09-27 DIAGNOSIS — N183 Chronic kidney disease, stage 3 (moderate): Secondary | ICD-10-CM | POA: Diagnosis not present

## 2014-09-27 DIAGNOSIS — Z79899 Other long term (current) drug therapy: Secondary | ICD-10-CM | POA: Diagnosis not present

## 2014-09-27 DIAGNOSIS — E1142 Type 2 diabetes mellitus with diabetic polyneuropathy: Secondary | ICD-10-CM | POA: Diagnosis not present

## 2014-09-27 DIAGNOSIS — E780 Pure hypercholesterolemia: Secondary | ICD-10-CM | POA: Diagnosis not present

## 2014-09-27 DIAGNOSIS — I2510 Atherosclerotic heart disease of native coronary artery without angina pectoris: Secondary | ICD-10-CM | POA: Diagnosis not present

## 2014-09-27 DIAGNOSIS — E1121 Type 2 diabetes mellitus with diabetic nephropathy: Secondary | ICD-10-CM | POA: Diagnosis not present

## 2014-09-27 DIAGNOSIS — I10 Essential (primary) hypertension: Secondary | ICD-10-CM | POA: Diagnosis not present

## 2014-09-27 DIAGNOSIS — D7589 Other specified diseases of blood and blood-forming organs: Secondary | ICD-10-CM | POA: Diagnosis not present

## 2014-11-29 ENCOUNTER — Encounter: Payer: Self-pay | Admitting: Interventional Cardiology

## 2014-11-29 ENCOUNTER — Ambulatory Visit (INDEPENDENT_AMBULATORY_CARE_PROVIDER_SITE_OTHER): Payer: Medicare Other | Admitting: Interventional Cardiology

## 2014-11-29 VITALS — BP 132/70 | HR 58 | Ht 73.0 in | Wt 180.0 lb

## 2014-11-29 DIAGNOSIS — E782 Mixed hyperlipidemia: Secondary | ICD-10-CM

## 2014-11-29 DIAGNOSIS — I2510 Atherosclerotic heart disease of native coronary artery without angina pectoris: Secondary | ICD-10-CM | POA: Diagnosis not present

## 2014-11-29 DIAGNOSIS — I10 Essential (primary) hypertension: Secondary | ICD-10-CM | POA: Diagnosis not present

## 2014-11-29 NOTE — Patient Instructions (Signed)
Your physician recommends that you continue on your current medications as directed. Please refer to the Current Medication list given to you today.  Your physician wants you to follow-up in: 6 months with Dr. Varanasi.  You will receive a reminder letter in the mail two months in advance. If you don't receive a letter, please call our office to schedule the follow-up appointment.  

## 2014-11-29 NOTE — Progress Notes (Signed)
Patient ID: Andrew Weaver, male   DOB: [birth year], 80 y.o.   MRN: [id number] Patient ID: Andrew Weaver, male   DOB: [birth year], 81 y.o.   MRN: [id number] Patient ID: Andrew Weaver, male   DOB: [birth year], 81 y.o.   MRN: [id number]    [place], [address] [place], [place]  [address] Phone: [phone] Fax:  [phone]  Date:  11/29/2014   ID:  Andrew Weaver, DOB [birth year], MRN [id number]  PCP:  Mathews Argyle, MD      History of Present Illness: Andrew Weaver is a 81 y.o. male who has 2 stents in the LAD in 2007 ( [facility] in 2000), out of town in [place]. He feels that his balance is still not good.  He does not use a cane well.  He has not been walking much. He feels fatigued generally. IN early 2013, he had a syncopal episode and had a cut over his left eye requiring stitches. He had an episode of slurred speech and was started on aspirin. No further syncope or dizziness.    CAD/ASCVD:  BP has been controlled. No systolics below 572. Denies : Chest pain.  Diaphoresis.  Dizziness.  Dyspnea on exertion.  Leg edema.  Nitroglycerin.  Palpitations.  Paroxysmal nocturnal dyspnea.  Syncope none recently.   BP readings in he 620-355 systolic range.  Bruises with aspirin and clopidogrel combination.  Weight stable.  His wife cannot walk.  He gets exercise by having to keep his apartment clean, since she is not able to help.  His A1C was high so Lantus was increased by Dr. Felipa Eth.  Wt Readings from Last 3 Encounters:  11/29/14 180 lb (81.647 kg)  05/27/14 180 lb 12.8 oz (82.01 kg)  11/10/13 184 lb (83.462 kg)     Past Medical History  Diagnosis Date  . Diabetes mellitus   . Hypertension   . Hyperlipidemia     goal LDL less than 70  . CAD (coronary artery disease)   . PVD (peripheral vascular disease)   . BPH (benign prostatic hypertrophy)   . Chronic kidney disease     borderline stage II/stage III    Current Outpatient Prescriptions    Medication Sig Dispense Refill  . aspirin 81 MG tablet Take 1 tablet (81 mg total) by mouth daily. 30 tablet   . clopidogrel (PLAVIX) 75 MG tablet Take 75 mg by mouth once.     . Fluticasone-Salmeterol (ADVAIR DISKUS) 500-50 MCG/DOSE AEPB Inhale 1 puff into the lungs 2 (two) times daily.    Marland Kitchen FOLIC ACID PO Take 1 tablet by mouth daily.    Marland Kitchen guaiFENesin-codeine 100-10 MG/5ML syrup Take 5 mLs by mouth 4 (four) times daily as needed for cough. 180 mL 0  . insulin glargine (LANTUS) 100 UNIT/ML injection Inject 22 Units into the skin daily.    . insulin regular (NOVOLIN R,HUMULIN R) 100 units/mL injection Inject 5-10 Units into the skin 3 (three) times daily before meals. 5 units if blood sugar under 150, 10 units if over 200 - estimates in between 150 and 200    . losartan-hydrochlorothiazide (HYZAAR) 100-25 MG per tablet Take 0.5 tablets by mouth daily.     . mirtazapine (REMERON) 15 MG tablet Take 15 mg by mouth at bedtime.     . nitroGLYCERIN (NITROSTAT) 0.4 MG SL tablet Place 1 tablet (0.4 mg total) under the tongue every 5 (five) minutes as needed for chest pain. 25 tablet 5  . PREVIDENT 5000 BOOSTER PLUS 1.1 %  PSTE     . rosuvastatin (CRESTOR) 20 MG tablet Take 10 mg by mouth at bedtime.     . tamsulosin (FLOMAX) 0.4 MG CAPS capsule Take 0.4 mg by mouth daily.      No current facility-administered medications for this visit.    Allergies:    Allergies  Allergen Reactions  . Lipitor [Atorvastatin]     fatigue    Social History:  The patient  reports that he quit smoking about 23 years ago. He has never used smokeless tobacco. He reports that he drinks alcohol. He reports that he does not use illicit drugs.   Family History:  The patient's family history includes Cancer in his father; Heart disease in his father, mother, and sister.   ROS:  Please see the history of present illness.  No nausea, vomiting.  No fevers, chills.  No focal weakness.  No dysuria.    All other systems reviewed  and negative.   PHYSICAL EXAM: VS:  BP 132/70 mmHg  Pulse 58  Ht 6\' 1"  (1.854 m)  Wt 180 lb (81.647 kg)  BMI 23.75 kg/m2 Well nourished, well developed, in no acute distress HEENT: normal Neck: no JVD, no carotid bruits Cardiac:  normal S1, S2; RRR;  Lungs:  clear to auscultation bilaterally, no wheezing, rhonchi or rales Abd: soft, nontender, no hepatomegaly Ext: no edema Skin: warm and dry Neuro:   no focal abnormalities noted Psych: normal afect  ECG: sinus bradycardia, NSST    ASSESSMENT AND PLAN:  CAD  Continue Clopidogrel Bisulfate Tablet, 75 MG, 1 tablet, Orally, Once a day, 30 day(s), 30, Refills 11 Notes: No angina.  Diet is not great.  He eats out 2-3 days/week, due to the food being bad at [facility], in his opinion.  No bleeding problems except for easy bruising.  No blood in stool.    2. Hypercholesterolemia  Continue Crestor Tablet, 20 MG, 1 tablet, Orally, Once a day Notes: LDL 58 in 09/2012. TG elevated to 216 in the past. Control diet. He does not enjoy the food at [facility]. Lipids reviewed.  7/15: LDL 65. Well controlled.   Need to get the most recent labs.    3. Hypertension  Continue Losartan Potassium-HCTZ Tablet, 50-12.5 MG, half tablet, Orally, Once a day       Notes: Controlled. No elevated readings even on half tab of losartan/HCTZ. If BP increases, would have to go back to full tablet daily. Target BP < 140/90. .    4. TIA  Notes: He is not sure if his speech was really slurred. Carotid DOppler in 12/13 was reviewed. No significant carotid disease. No bleeding problems since adding aspirin back except bruising.   No further issues like the prior event noticed.   5. Others  Notes: No syncope since decrease in BP med.    He prefers to be seen in 6 months again. Preventive Medicine  Adult topics discussed:  Diet: healthy diet, low calorie, low fat.  Exercise: 5 days a week, at least 30 minutes of aerobic exercise, preferably on the exercise  bike.     Signed, Mina Marble, MD, [facility] 11/29/2014 10:32 AM

## 2014-11-30 ENCOUNTER — Encounter: Payer: Self-pay | Admitting: Interventional Cardiology

## 2015-03-31 ENCOUNTER — Other Ambulatory Visit: Payer: Self-pay | Admitting: Geriatric Medicine

## 2015-03-31 ENCOUNTER — Ambulatory Visit
Admission: RE | Admit: 2015-03-31 | Discharge: 2015-03-31 | Disposition: A | Discharge status: Home / Self Care | Payer: Medicare Other | Source: Ambulatory Visit | Attending: Geriatric Medicine | Admitting: Geriatric Medicine

## 2015-03-31 DIAGNOSIS — E1142 Type 2 diabetes mellitus with diabetic polyneuropathy: Secondary | ICD-10-CM | POA: Diagnosis not present

## 2015-03-31 DIAGNOSIS — I10 Essential (primary) hypertension: Secondary | ICD-10-CM | POA: Diagnosis not present

## 2015-03-31 DIAGNOSIS — J986 Disorders of diaphragm: Secondary | ICD-10-CM | POA: Diagnosis not present

## 2015-03-31 DIAGNOSIS — R0689 Other abnormalities of breathing: Secondary | ICD-10-CM

## 2015-03-31 DIAGNOSIS — D7589 Other specified diseases of blood and blood-forming organs: Secondary | ICD-10-CM | POA: Diagnosis not present

## 2015-03-31 DIAGNOSIS — D692 Other nonthrombocytopenic purpura: Secondary | ICD-10-CM | POA: Diagnosis not present

## 2015-03-31 DIAGNOSIS — Z1389 Encounter for screening for other disorder: Secondary | ICD-10-CM | POA: Diagnosis not present

## 2015-03-31 DIAGNOSIS — R918 Other nonspecific abnormal finding of lung field: Secondary | ICD-10-CM | POA: Diagnosis not present

## 2015-03-31 DIAGNOSIS — Z79899 Other long term (current) drug therapy: Secondary | ICD-10-CM | POA: Diagnosis not present

## 2015-03-31 DIAGNOSIS — E780 Pure hypercholesterolemia: Secondary | ICD-10-CM | POA: Diagnosis not present

## 2015-03-31 DIAGNOSIS — Z0000 Encounter for general adult medical examination without abnormal findings: Secondary | ICD-10-CM | POA: Diagnosis not present

## 2015-03-31 DIAGNOSIS — Z23 Encounter for immunization: Secondary | ICD-10-CM | POA: Diagnosis not present

## 2015-03-31 DIAGNOSIS — E1121 Type 2 diabetes mellitus with diabetic nephropathy: Secondary | ICD-10-CM | POA: Diagnosis not present

## 2015-05-03 DIAGNOSIS — E1121 Type 2 diabetes mellitus with diabetic nephropathy: Secondary | ICD-10-CM | POA: Diagnosis not present

## 2015-05-03 DIAGNOSIS — I129 Hypertensive chronic kidney disease with stage 1 through stage 4 chronic kidney disease, or unspecified chronic kidney disease: Secondary | ICD-10-CM | POA: Diagnosis not present

## 2015-05-03 DIAGNOSIS — Z79899 Other long term (current) drug therapy: Secondary | ICD-10-CM | POA: Diagnosis not present

## 2015-05-03 DIAGNOSIS — Z794 Long term (current) use of insulin: Secondary | ICD-10-CM | POA: Diagnosis not present

## 2015-05-03 DIAGNOSIS — E780 Pure hypercholesterolemia: Secondary | ICD-10-CM | POA: Diagnosis not present

## 2015-05-03 DIAGNOSIS — N183 Chronic kidney disease, stage 3 (moderate): Secondary | ICD-10-CM | POA: Diagnosis not present

## 2015-05-03 DIAGNOSIS — E1142 Type 2 diabetes mellitus with diabetic polyneuropathy: Secondary | ICD-10-CM | POA: Diagnosis not present

## 2015-06-20 DIAGNOSIS — Z23 Encounter for immunization: Secondary | ICD-10-CM | POA: Diagnosis not present

## 2015-07-07 DIAGNOSIS — N390 Urinary tract infection, site not specified: Secondary | ICD-10-CM | POA: Diagnosis not present

## 2015-07-08 DIAGNOSIS — I10 Essential (primary) hypertension: Secondary | ICD-10-CM | POA: Diagnosis not present

## 2015-07-11 DIAGNOSIS — K5900 Constipation, unspecified: Secondary | ICD-10-CM | POA: Diagnosis not present

## 2015-07-11 DIAGNOSIS — R410 Disorientation, unspecified: Secondary | ICD-10-CM | POA: Diagnosis not present

## 2015-07-11 DIAGNOSIS — G459 Transient cerebral ischemic attack, unspecified: Secondary | ICD-10-CM | POA: Diagnosis not present

## 2015-07-21 DIAGNOSIS — D225 Melanocytic nevi of trunk: Secondary | ICD-10-CM | POA: Diagnosis not present

## 2015-07-21 DIAGNOSIS — X32XXXD Exposure to sunlight, subsequent encounter: Secondary | ICD-10-CM | POA: Diagnosis not present

## 2015-07-21 DIAGNOSIS — L820 Inflamed seborrheic keratosis: Secondary | ICD-10-CM | POA: Diagnosis not present

## 2015-07-21 DIAGNOSIS — L570 Actinic keratosis: Secondary | ICD-10-CM | POA: Diagnosis not present

## 2015-07-27 DIAGNOSIS — K5900 Constipation, unspecified: Secondary | ICD-10-CM | POA: Diagnosis not present

## 2015-08-08 ENCOUNTER — Ambulatory Visit
Admission: RE | Admit: 2015-08-08 | Discharge: 2015-08-08 | Disposition: A | Discharge status: Home / Self Care | Payer: Medicare Other | Source: Ambulatory Visit | Attending: Geriatric Medicine | Admitting: Geriatric Medicine

## 2015-08-08 ENCOUNTER — Other Ambulatory Visit: Payer: Self-pay | Admitting: Geriatric Medicine

## 2015-08-08 DIAGNOSIS — K5909 Other constipation: Secondary | ICD-10-CM

## 2015-08-08 DIAGNOSIS — K5900 Constipation, unspecified: Secondary | ICD-10-CM | POA: Diagnosis not present

## 2015-09-04 DIAGNOSIS — K5900 Constipation, unspecified: Secondary | ICD-10-CM | POA: Diagnosis not present

## 2015-09-15 DIAGNOSIS — N183 Chronic kidney disease, stage 3 (moderate): Secondary | ICD-10-CM | POA: Diagnosis not present

## 2015-09-15 DIAGNOSIS — Z794 Long term (current) use of insulin: Secondary | ICD-10-CM | POA: Diagnosis not present

## 2015-09-15 DIAGNOSIS — I129 Hypertensive chronic kidney disease with stage 1 through stage 4 chronic kidney disease, or unspecified chronic kidney disease: Secondary | ICD-10-CM | POA: Diagnosis not present

## 2015-09-15 DIAGNOSIS — E1142 Type 2 diabetes mellitus with diabetic polyneuropathy: Secondary | ICD-10-CM | POA: Diagnosis not present

## 2015-09-15 DIAGNOSIS — K5900 Constipation, unspecified: Secondary | ICD-10-CM | POA: Diagnosis not present

## 2015-09-15 DIAGNOSIS — E1121 Type 2 diabetes mellitus with diabetic nephropathy: Secondary | ICD-10-CM | POA: Diagnosis not present

## 2015-10-04 DIAGNOSIS — K5900 Constipation, unspecified: Secondary | ICD-10-CM | POA: Diagnosis not present

## 2015-10-04 DIAGNOSIS — E1142 Type 2 diabetes mellitus with diabetic polyneuropathy: Secondary | ICD-10-CM | POA: Diagnosis not present

## 2015-10-04 DIAGNOSIS — Z794 Long term (current) use of insulin: Secondary | ICD-10-CM | POA: Diagnosis not present

## 2015-10-04 DIAGNOSIS — Z79899 Other long term (current) drug therapy: Secondary | ICD-10-CM | POA: Diagnosis not present

## 2015-10-04 DIAGNOSIS — E1121 Type 2 diabetes mellitus with diabetic nephropathy: Secondary | ICD-10-CM | POA: Diagnosis not present

## 2015-10-04 DIAGNOSIS — N183 Chronic kidney disease, stage 3 (moderate): Secondary | ICD-10-CM | POA: Diagnosis not present

## 2015-10-04 DIAGNOSIS — I129 Hypertensive chronic kidney disease with stage 1 through stage 4 chronic kidney disease, or unspecified chronic kidney disease: Secondary | ICD-10-CM | POA: Diagnosis not present

## 2015-10-09 ENCOUNTER — Inpatient Hospital Stay (HOSPITAL_COMMUNITY)
Admission: EM | Admit: 2015-10-09 | Discharge: 2015-10-11 | DRG: 125 | Disposition: A | Discharge status: Skilled Nursing Facility | Payer: Medicare Other | Attending: Internal Medicine | Admitting: Internal Medicine

## 2015-10-09 ENCOUNTER — Emergency Department (HOSPITAL_COMMUNITY): Payer: Medicare Other

## 2015-10-09 ENCOUNTER — Encounter (HOSPITAL_COMMUNITY): Payer: Self-pay | Admitting: Emergency Medicine

## 2015-10-09 DIAGNOSIS — K5900 Constipation, unspecified: Secondary | ICD-10-CM | POA: Diagnosis present

## 2015-10-09 DIAGNOSIS — W19XXXA Unspecified fall, initial encounter: Secondary | ICD-10-CM | POA: Diagnosis not present

## 2015-10-09 DIAGNOSIS — N183 Chronic kidney disease, stage 3 (moderate): Secondary | ICD-10-CM | POA: Diagnosis not present

## 2015-10-09 DIAGNOSIS — I2510 Atherosclerotic heart disease of native coronary artery without angina pectoris: Secondary | ICD-10-CM | POA: Diagnosis not present

## 2015-10-09 DIAGNOSIS — S199XXA Unspecified injury of neck, initial encounter: Secondary | ICD-10-CM | POA: Diagnosis not present

## 2015-10-09 DIAGNOSIS — E1122 Type 2 diabetes mellitus with diabetic chronic kidney disease: Secondary | ICD-10-CM | POA: Diagnosis not present

## 2015-10-09 DIAGNOSIS — Z794 Long term (current) use of insulin: Secondary | ICD-10-CM | POA: Diagnosis not present

## 2015-10-09 DIAGNOSIS — R55 Syncope and collapse: Secondary | ICD-10-CM | POA: Diagnosis not present

## 2015-10-09 DIAGNOSIS — S0990XA Unspecified injury of head, initial encounter: Secondary | ICD-10-CM | POA: Diagnosis not present

## 2015-10-09 DIAGNOSIS — Z66 Do not resuscitate: Secondary | ICD-10-CM | POA: Diagnosis not present

## 2015-10-09 DIAGNOSIS — Z7902 Long term (current) use of antithrombotics/antiplatelets: Secondary | ICD-10-CM

## 2015-10-09 DIAGNOSIS — T148 Other injury of unspecified body region: Secondary | ICD-10-CM | POA: Diagnosis not present

## 2015-10-09 DIAGNOSIS — Z888 Allergy status to other drugs, medicaments and biological substances status: Secondary | ICD-10-CM | POA: Diagnosis not present

## 2015-10-09 DIAGNOSIS — I951 Orthostatic hypotension: Secondary | ICD-10-CM | POA: Diagnosis not present

## 2015-10-09 DIAGNOSIS — Z7982 Long term (current) use of aspirin: Secondary | ICD-10-CM | POA: Diagnosis not present

## 2015-10-09 DIAGNOSIS — I10 Essential (primary) hypertension: Secondary | ICD-10-CM | POA: Diagnosis not present

## 2015-10-09 DIAGNOSIS — E1151 Type 2 diabetes mellitus with diabetic peripheral angiopathy without gangrene: Secondary | ICD-10-CM | POA: Diagnosis not present

## 2015-10-09 DIAGNOSIS — S0180XA Unspecified open wound of other part of head, initial encounter: Secondary | ICD-10-CM | POA: Diagnosis not present

## 2015-10-09 DIAGNOSIS — Z87891 Personal history of nicotine dependence: Secondary | ICD-10-CM

## 2015-10-09 DIAGNOSIS — I129 Hypertensive chronic kidney disease with stage 1 through stage 4 chronic kidney disease, or unspecified chronic kidney disease: Secondary | ICD-10-CM | POA: Diagnosis not present

## 2015-10-09 DIAGNOSIS — Z955 Presence of coronary angioplasty implant and graft: Secondary | ICD-10-CM | POA: Diagnosis not present

## 2015-10-09 DIAGNOSIS — E119 Type 2 diabetes mellitus without complications: Secondary | ICD-10-CM | POA: Diagnosis not present

## 2015-10-09 DIAGNOSIS — W01198A Fall on same level from slipping, tripping and stumbling with subsequent striking against other object, initial encounter: Secondary | ICD-10-CM | POA: Diagnosis not present

## 2015-10-09 DIAGNOSIS — E785 Hyperlipidemia, unspecified: Secondary | ICD-10-CM | POA: Diagnosis present

## 2015-10-09 DIAGNOSIS — S0012XA Contusion of left eyelid and periocular area, initial encounter: Secondary | ICD-10-CM | POA: Diagnosis not present

## 2015-10-09 LAB — URINALYSIS, ROUTINE W REFLEX MICROSCOPIC
Bilirubin Urine: NEGATIVE
Glucose, UA: NEGATIVE mg/dL
Ketones, ur: NEGATIVE mg/dL
Leukocytes, UA: NEGATIVE
Nitrite: NEGATIVE
Protein, ur: 30 mg/dL — AB
Specific Gravity, Urine: 1.018 (ref 1.005–1.030)
pH: 7 (ref 5.0–8.0)

## 2015-10-09 LAB — URINE MICROSCOPIC-ADD ON: WBC, UA: NONE SEEN WBC/hpf (ref 0–5)

## 2015-10-09 LAB — CBC WITH DIFFERENTIAL/PLATELET
Basophils Absolute: 0 10*3/uL (ref 0.0–0.1)
Basophils Relative: 0 %
Eosinophils Absolute: 0 10*3/uL (ref 0.0–0.7)
Eosinophils Relative: 0 %
HCT: 41.2 % (ref 39.0–52.0)
Hemoglobin: 13.6 g/dL (ref 13.0–17.0)
Lymphocytes Relative: 5 %
Lymphs Abs: 0.5 10*3/uL — ABNORMAL LOW (ref 0.7–4.0)
MCH: 33 pg (ref 26.0–34.0)
MCHC: 33 g/dL (ref 30.0–36.0)
MCV: 100 fL (ref 78.0–100.0)
Monocytes Absolute: 0.9 10*3/uL (ref 0.1–1.0)
Monocytes Relative: 9 %
Neutro Abs: 8.3 10*3/uL — ABNORMAL HIGH (ref 1.7–7.7)
Neutrophils Relative %: 86 %
Platelets: 182 10*3/uL (ref 150–400)
RBC: 4.12 MIL/uL — ABNORMAL LOW (ref 4.22–5.81)
RDW: 13.3 % (ref 11.5–15.5)
WBC: 9.7 10*3/uL (ref 4.0–10.5)

## 2015-10-09 LAB — COMPREHENSIVE METABOLIC PANEL
ALT: 22 U/L (ref 17–63)
AST: 33 U/L (ref 15–41)
Albumin: 3.2 g/dL — ABNORMAL LOW (ref 3.5–5.0)
Alkaline Phosphatase: 88 U/L (ref 38–126)
Anion gap: 7 (ref 5–15)
BUN: 10 mg/dL (ref 6–20)
CO2: 26 mmol/L (ref 22–32)
Calcium: 8.5 mg/dL — ABNORMAL LOW (ref 8.9–10.3)
Chloride: 105 mmol/L (ref 101–111)
Creatinine, Ser: 1.31 mg/dL — ABNORMAL HIGH (ref 0.61–1.24)
GFR calc Af Amer: 52 mL/min — ABNORMAL LOW (ref >60)
GFR calc non Af Amer: 45 mL/min — ABNORMAL LOW (ref >60)
Glucose, Bld: 115 mg/dL — ABNORMAL HIGH (ref 65–99)
Potassium: 4.1 mmol/L (ref 3.5–5.1)
Sodium: 138 mmol/L (ref 135–145)
Total Bilirubin: 0.7 mg/dL (ref 0.3–1.2)
Total Protein: 5.9 g/dL — ABNORMAL LOW (ref 6.5–8.1)

## 2015-10-09 LAB — CBG MONITORING, ED: Glucose-Capillary: 111 mg/dL — ABNORMAL HIGH (ref 65–99)

## 2015-10-09 LAB — GLUCOSE, CAPILLARY: Glucose-Capillary: 135 mg/dL — ABNORMAL HIGH (ref 65–99)

## 2015-10-09 LAB — TROPONIN I: Troponin I: 0.03 ng/mL (ref <0.031)

## 2015-10-09 MED ORDER — ASPIRIN EC 81 MG PO TBEC
81.0000 mg | DELAYED_RELEASE_TABLET | Freq: Every day | ORAL | Status: DC
  Administered 2015-10-10 – 2015-10-11 (×2): 81 mg via ORAL
  Filled 2015-10-09 (×3): qty 1

## 2015-10-09 MED ORDER — INSULIN ASPART 100 UNIT/ML ~~LOC~~ SOLN
5.0000 [IU] | Freq: Three times a day (TID) | SUBCUTANEOUS | Status: DC
  Administered 2015-10-10 – 2015-10-11 (×6): 5 [IU] via SUBCUTANEOUS

## 2015-10-09 MED ORDER — ENOXAPARIN SODIUM 40 MG/0.4ML ~~LOC~~ SOLN
40.0000 mg | Freq: Every day | SUBCUTANEOUS | Status: DC
  Administered 2015-10-10 – 2015-10-11 (×2): 40 mg via SUBCUTANEOUS
  Filled 2015-10-09 (×2): qty 0.4

## 2015-10-09 MED ORDER — LOSARTAN POTASSIUM-HCTZ 100-25 MG PO TABS: 0.5000 | ORAL_TABLET | Freq: Every day | ORAL | Status: DC

## 2015-10-09 MED ORDER — GUAIFENESIN-CODEINE 100-10 MG/5ML PO SOLN: 5.0000 mL | Freq: Four times a day (QID) | ORAL | Status: DC | PRN

## 2015-10-09 MED ORDER — INSULIN ASPART 100 UNIT/ML ~~LOC~~ SOLN
0.0000 [IU] | Freq: Three times a day (TID) | SUBCUTANEOUS | Status: DC
  Administered 2015-10-10: 2 [IU] via SUBCUTANEOUS
  Administered 2015-10-10 (×2): 1 [IU] via SUBCUTANEOUS
  Administered 2015-10-11 (×2): 2 [IU] via SUBCUTANEOUS

## 2015-10-09 MED ORDER — NITROGLYCERIN 0.4 MG SL SUBL: 0.4000 mg | SUBLINGUAL_TABLET | SUBLINGUAL | Status: DC | PRN

## 2015-10-09 MED ORDER — CLOPIDOGREL BISULFATE 75 MG PO TABS
75.0000 mg | ORAL_TABLET | Freq: Every day | ORAL | Status: DC
  Administered 2015-10-10 – 2015-10-11 (×2): 75 mg via ORAL
  Filled 2015-10-09 (×2): qty 1

## 2015-10-09 MED ORDER — LOSARTAN POTASSIUM 50 MG PO TABS
50.0000 mg | ORAL_TABLET | Freq: Every day | ORAL | Status: DC
  Administered 2015-10-10 – 2015-10-11 (×2): 50 mg via ORAL
  Filled 2015-10-09 (×2): qty 1

## 2015-10-09 MED ORDER — INSULIN GLARGINE 100 UNIT/ML ~~LOC~~ SOLN
20.0000 [IU] | Freq: Every day | SUBCUTANEOUS | Status: DC
  Administered 2015-10-10 – 2015-10-11 (×2): 20 [IU] via SUBCUTANEOUS
  Filled 2015-10-09 (×3): qty 0.2

## 2015-10-09 MED ORDER — CLOPIDOGREL BISULFATE 75 MG PO TABS: 75.0000 mg | ORAL_TABLET | Freq: Once | ORAL | Status: DC

## 2015-10-09 MED ORDER — ACETAMINOPHEN 500 MG PO TABS
1000.0000 mg | ORAL_TABLET | Freq: Once | ORAL | Status: AC
  Administered 2015-10-09: 1000 mg via ORAL
  Filled 2015-10-09: qty 2

## 2015-10-09 MED ORDER — HYDROCHLOROTHIAZIDE 12.5 MG PO CAPS
12.5000 mg | ORAL_CAPSULE | Freq: Every day | ORAL | Status: DC
  Administered 2015-10-10 – 2015-10-11 (×2): 12.5 mg via ORAL
  Filled 2015-10-09 (×3): qty 1

## 2015-10-09 NOTE — ED Notes (Signed)
Attempted report 

## 2015-10-09 NOTE — H&P (Signed)
History and Physical  Edras Stolte [id number] DOB: [birth year] DOA: 10/09/2015  PCP: Mathews Argyle, MD   Chief Complaint: fall  History of Present Illness:   Patient is a 82 yo pleasant male with history of CAD s/p stent#2 in LAD who came with cc of fall. He said he was leaving to get dinner when his legs got weak suddenly and he felt forward hitting his head against the door leaving him with large periorbital hematoma. He was able to stand by himself in a few minutes. He denied any sensory abnormalities or other focal motor deficits. He denied chest pain, dyspnea, cough, N/V/D/C/abd pain, dysuria. He denied vertigo or dizziness. He said he is supposed to use a walker but he does not like it and he can walk on his own. From his records, it seems that he had issues with imbalance in the past as well.   Review of Systems:  CONSTITUTIONAL:  No night sweats.  No fatigue, malaise, lethargy.  No fever or chills. Eyes:  No visual changes.  No eye pain.  No eye discharge.   ENT:    No epistaxis.  No sinus pain.  No sore throat.  No ear pain.  No congestion. RESPIRATORY:  No cough.  No wheeze.  No hemoptysis.  No shortness of breath. CARDIOVASCULAR:  No chest pains.  No palpitations. GASTROINTESTINAL:  No abdominal pain.  No nausea or vomiting.  No diarrhea or constipation.  No hematemesis.  No hematochezia.  No melena. GENITOURINARY:  No urgency.  No frequency.  No dysuria.  No hematuria.  No obstructive symptoms.  No discharge.  No pain.  No significant abnormal bleeding. MUSCULOSKELETAL:  No musculoskeletal pain.  No joint swelling.  No arthritis. NEUROLOGICAL:  No confusion.  +weakness. No headache. No seizure. PSYCHIATRIC:  No depression. No anxiety. No suicidal ideation. SKIN:  No rashes.  No lesions.  +wounds. ENDOCRINE:  No unexplained weight loss.  No polydipsia.  No polyuria.  No polyphagia. HEMATOLOGIC:  No anemia.  No purpura.  No petechiae.  No bleeding.    ALLERGIC AND IMMUNOLOGIC:  No pruritus.  No swelling Other:  Past Medical and Surgical History:   Past Medical History  Diagnosis Date  . Diabetes mellitus   . Hypertension   . Hyperlipidemia     goal LDL less than 70  . CAD (coronary artery disease)   . PVD (peripheral vascular disease) ([place])   . BPH (benign prostatic hypertrophy)   . Chronic kidney disease     borderline stage II/stage III   Past Surgical History  Procedure Laterality Date  . Eye surgery    . Coronary angioplasty      Social History:   reports that he quit smoking about 24 years ago. He has never used smokeless tobacco. He reports that he drinks alcohol. He reports that he does not use illicit drugs.   Allergies  Allergen Reactions  . Lipitor [Atorvastatin]     fatigue    Family History  Problem Relation Age of Onset  . Heart disease Mother   . Heart disease Father   . Cancer Father   . Heart disease Sister       Prior to Admission medications   Medication Sig Start Date End Date Taking? Authorizing Provider  aspirin 81 MG tablet Take 1 tablet (81 mg total) by mouth daily. 05/27/14  Yes Jettie Booze, MD  atorvastatin (LIPITOR) 10 MG tablet Take 10 mg by mouth daily.   Yes Historical Provider,  MD  clopidogrel (PLAVIX) 75 MG tablet Take 75 mg by mouth once.  08/23/13  Yes Historical Provider, MD  insulin glargine (LANTUS) 100 UNIT/ML injection Inject 20 Units into the skin daily.    Yes Historical Provider, MD  insulin regular (NOVOLIN R,HUMULIN R) 100 units/mL injection Inject 5-10 Units into the skin 3 (three) times daily before meals. 5 units if blood sugar under 150, 10 units if over 200 - estimates in between 150 and 200   Yes Historical Provider, MD  LINZESS 290 MCG CAPS capsule Take 290 mcg by mouth daily. 09/04/15  Yes Historical Provider, MD  nitroGLYCERIN (NITROSTAT) 0.4 MG SL tablet Place 1 tablet (0.4 mg total) under the tongue every 5 (five) minutes as needed for chest pain.  11/18/13  Yes Jettie Booze, MD  PREVIDENT 5000 BOOSTER PLUS 1.1 % PSTE  10/24/13  Yes Historical Provider, MD  tamsulosin (FLOMAX) 0.4 MG CAPS capsule Take 0.4 mg by mouth daily.  09/20/13  Yes Historical Provider, MD  guaiFENesin-codeine 100-10 MG/5ML syrup Take 5 mLs by mouth 4 (four) times daily as needed for cough. Patient not taking: Reported on 10/09/2015 07/18/13   Shawnee Knapp, MD  losartan-hydrochlorothiazide (HYZAAR) 100-25 MG per tablet Take 0.5 tablets by mouth daily.     Historical Provider, MD    Physical Exam: BP 154/94 mmHg  Pulse 87  Temp(Src) 97.6 F (36.4 C)  Resp 23  SpO2 94%  GENERAL : Well developed, well nourished, alert and cooperative, and appears to be in no acute distress. HEAD: normocephalic. EYES: PERRL, EOMI.  vision is grossly intact. Periorbital hematoma in the left. NOSE: No nasal discharge. THROAT: Oral cavity and pharynx normal.  NECK: Neck supple CARDIAC: Normal S1 and S2. No S3, S4 or murmurs. Rhythm is regular. There is no peripheral edema, cyanosis or pallor.+ LUNGS: Clear to auscultation + ABDOMEN: Positive bowel sounds. Soft, nondistended, nontender. No guarding or rebound. No masses. NEUROLOGICAL: The mental examination revealed the patient was oriented to person, place, and time.CN II-XII intact.  SKIN: Skin normal color PSYCHIATRIC:  The patient was able to demonstrate good judgement and reason, without hallucinations,          Labs on Admission:  Reviewed.   Radiological Exams on Admission: Ct Head Wo Contrast  10/09/2015  CLINICAL DATA:  Status post fall with a blow to the head today. Initial encounter. EXAM: CT HEAD WITHOUT CONTRAST CT CERVICAL SPINE WITHOUT CONTRAST TECHNIQUE: Multidetector CT imaging of the head and cervical spine was performed following the standard protocol without intravenous contrast. Multiplanar CT image reconstructions of the cervical spine were also generated. COMPARISON:  None. FINDINGS: CT HEAD FINDINGS A  large hematoma is seen about the left eye without underlying fracture. The globes are intact. Orbital fat is clear. The patient is status post bilateral lens extraction. There is cortical atrophy and chronic microvascular ischemic change. No evidence of acute intracranial abnormality including hemorrhage, infarct, mass lesion, mass effect, midline shift or abnormal extra-axial fluid collection is identified. No hydrocephalus or pneumocephalus. The calvarium is intact. CT CERVICAL SPINE FINDINGS No fracture or malalignment of the cervical spine is identified. There is multilevel facet arthropathy. Loss of disc space height appears worst at C5-6. Paraspinous structures are unremarkable. IMPRESSION: Large hematoma about the left eye without underlying fracture. No acute intracranial abnormality or acute abnormality cervical spine. Atrophy and chronic microvascular ischemic change. Cervical spondylosis. Electronically Signed   By: Inge Rise M.D.   On: 10/09/2015 19:19  Ct Cervical Spine Wo Contrast  10/09/2015  CLINICAL DATA:  Status post fall with a blow to the head today. Initial encounter. EXAM: CT HEAD WITHOUT CONTRAST CT CERVICAL SPINE WITHOUT CONTRAST TECHNIQUE: Multidetector CT imaging of the head and cervical spine was performed following the standard protocol without intravenous contrast. Multiplanar CT image reconstructions of the cervical spine were also generated. COMPARISON:  None. FINDINGS: CT HEAD FINDINGS A large hematoma is seen about the left eye without underlying fracture. The globes are intact. Orbital fat is clear. The patient is status post bilateral lens extraction. There is cortical atrophy and chronic microvascular ischemic change. No evidence of acute intracranial abnormality including hemorrhage, infarct, mass lesion, mass effect, midline shift or abnormal extra-axial fluid collection is identified. No hydrocephalus or pneumocephalus. The calvarium is intact. CT CERVICAL SPINE  FINDINGS No fracture or malalignment of the cervical spine is identified. There is multilevel facet arthropathy. Loss of disc space height appears worst at C5-6. Paraspinous structures are unremarkable. IMPRESSION: Large hematoma about the left eye without underlying fracture. No acute intracranial abnormality or acute abnormality cervical spine. Atrophy and chronic microvascular ischemic change. Cervical spondylosis. Electronically Signed   By: Inge Rise M.D.   On: 10/09/2015 19:19    EKG:  Independently reviewed. NSR  Assessment/Plan  Fall: Likely mechanical. DDx: TIA, hypoglycemia ( did not take insulin prior but did not check BS after fall), orthostatic hypotension. PT consult in am. Will check orthostatic.  Carotid US ordered.  Cont asp (MRI unlikely to change management). No LOC. Keep on Tele. Will check Echo  DM: cont home meds with glycemic protocol. Low dose correction.   HTN: cont home meds  AKI vs. CKD: likely CKD. Will monitor.    DVT prophylaxis: [facility] enoxaparin Code Status: Full code     Gennaro Africa M.D [facility]

## 2015-10-09 NOTE — ED Notes (Signed)
Attempted report. Gerald Stabs Camera operator stated that the room is currently being cleaned.

## 2015-10-09 NOTE — ED Provider Notes (Signed)
Medical screening examination/treatment/procedure(s) were conducted as a shared visit with non-physician practitioner(s) and myself.  I personally evaluated the patient during the encounter.   EKG Interpretation   Date/Time:  Monday October 09 2015 17:36:59 EST Ventricular Rate:  71 PR Interval:  174 QRS Duration: 81 QT Interval:  352 QTC Calculation: 382 R Axis:   52 Text Interpretation:  Sinus rhythm Nonspecific T abnormalities, lateral  leads No previous ECGs available Confirmed by Darrnell Mangiaracina  MD, Jovin Fester [phone])  on 10/09/2015 5:59:45 PM      Results for orders placed or performed during the hospital encounter of 10/09/15  Comprehensive metabolic panel  Result Value Ref Range   Sodium 138 135 - 145 mmol/L   Potassium 4.1 3.5 - 5.1 mmol/L   Chloride 105 101 - 111 mmol/L   CO2 26 22 - 32 mmol/L   Glucose, Bld 115 (H) 65 - 99 mg/dL   BUN 10 6 - 20 mg/dL   Creatinine, Ser 1.31 (H) 0.61 - 1.24 mg/dL   Calcium 8.5 (L) 8.9 - 10.3 mg/dL   Total Protein 5.9 (L) 6.5 - 8.1 g/dL   Albumin 3.2 (L) 3.5 - 5.0 g/dL   AST 33 15 - 41 U/L   ALT 22 17 - 63 U/L   Alkaline Phosphatase 88 38 - 126 U/L   Total Bilirubin 0.7 0.3 - 1.2 mg/dL   GFR calc non Af Amer 45 (L) >60 mL/min   GFR calc Af Amer 52 (L) >60 mL/min   Anion gap 7 5 - 15  CBC with Differential  Result Value Ref Range   WBC 9.7 4.0 - 10.5 K/uL   RBC 4.12 (L) 4.22 - 5.81 MIL/uL   Hemoglobin 13.6 13.0 - 17.0 g/dL   HCT 41.2 39.0 - 52.0 %   MCV 100.0 78.0 - 100.0 fL   MCH 33.0 26.0 - 34.0 pg   MCHC 33.0 30.0 - 36.0 g/dL   RDW 13.3 11.5 - 15.5 %   Platelets 182 150 - 400 K/uL   Neutrophils Relative % 86 %   Neutro Abs 8.3 (H) 1.7 - 7.7 K/uL   Lymphocytes Relative 5 %   Lymphs Abs 0.5 (L) 0.7 - 4.0 K/uL   Monocytes Relative 9 %   Monocytes Absolute 0.9 0.1 - 1.0 K/uL   Eosinophils Relative 0 %   Eosinophils Absolute 0.0 0.0 - 0.7 K/uL   Basophils Relative 0 %   Basophils Absolute 0.0 0.0 - 0.1 K/uL  Troponin I  Result  Value Ref Range   Troponin I 0.03 <0.031 ng/mL   Ct Head Wo Contrast  10/09/2015  CLINICAL DATA:  Status post fall with a blow to the head today. Initial encounter. EXAM: CT HEAD WITHOUT CONTRAST CT CERVICAL SPINE WITHOUT CONTRAST TECHNIQUE: Multidetector CT imaging of the head and cervical spine was performed following the standard protocol without intravenous contrast. Multiplanar CT image reconstructions of the cervical spine were also generated. COMPARISON:  None. FINDINGS: CT HEAD FINDINGS A large hematoma is seen about the left eye without underlying fracture. The globes are intact. Orbital fat is clear. The patient is status post bilateral lens extraction. There is cortical atrophy and chronic microvascular ischemic change. No evidence of acute intracranial abnormality including hemorrhage, infarct, mass lesion, mass effect, midline shift or abnormal extra-axial fluid collection is identified. No hydrocephalus or pneumocephalus. The calvarium is intact. CT CERVICAL SPINE FINDINGS No fracture or malalignment of the cervical spine is identified. There is multilevel facet arthropathy. Loss of disc  space height appears worst at C5-6. Paraspinous structures are unremarkable. IMPRESSION: Large hematoma about the left eye without underlying fracture. No acute intracranial abnormality or acute abnormality cervical spine. Atrophy and chronic microvascular ischemic change. Cervical spondylosis. Electronically Signed   By: Inge Rise M.D.   On: 10/09/2015 19:19   Ct Cervical Spine Wo Contrast  10/09/2015  CLINICAL DATA:  Status post fall with a blow to the head today. Initial encounter. EXAM: CT HEAD WITHOUT CONTRAST CT CERVICAL SPINE WITHOUT CONTRAST TECHNIQUE: Multidetector CT imaging of the head and cervical spine was performed following the standard protocol without intravenous contrast. Multiplanar CT image reconstructions of the cervical spine were also generated. COMPARISON:  None. FINDINGS: CT HEAD  FINDINGS A large hematoma is seen about the left eye without underlying fracture. The globes are intact. Orbital fat is clear. The patient is status post bilateral lens extraction. There is cortical atrophy and chronic microvascular ischemic change. No evidence of acute intracranial abnormality including hemorrhage, infarct, mass lesion, mass effect, midline shift or abnormal extra-axial fluid collection is identified. No hydrocephalus or pneumocephalus. The calvarium is intact. CT CERVICAL SPINE FINDINGS No fracture or malalignment of the cervical spine is identified. There is multilevel facet arthropathy. Loss of disc space height appears worst at C5-6. Paraspinous structures are unremarkable. IMPRESSION: Large hematoma about the left eye without underlying fracture. No acute intracranial abnormality or acute abnormality cervical spine. Atrophy and chronic microvascular ischemic change. Cervical spondylosis. Electronically Signed   By: Inge Rise M.D.   On: 10/09/2015 19:19    Patient status post syncopal event. Patient is from [place]. Was reported as a fall sounds more like it was syncope. Patient struck his head he remembers moving towards the wall but does not remember hitting it. He has a large hematoma left for head above his left eye with a fair amount of contusion. CT scan of head neck without any acute findings. Patient is feeling back to baseline at this point in time. Patient will require admission for the syncope.  Patient is a DO NOT RESUSCITATE.  Fredia Sorrow, MD 10/09/15 2137

## 2015-10-09 NOTE — ED Notes (Addendum)
Pt arrives via [facility] from [facility] for a fall. Pt reports he was getting up from his chair when he began to have an odd sensation in his chest and started to feel weak, pt fell and struck his head on the wall. Large knot noted to left forehead. Pt denies pain. EMS reports no LOC. Pt a/o x4, resp e/u. NAD. 4mg  of zofran and 324 mg asa given PTA

## 2015-10-09 NOTE — ED Notes (Signed)
Andrew Weaver [phone] Daughter

## 2015-10-10 ENCOUNTER — Observation Stay (HOSPITAL_COMMUNITY): Payer: Medicare Other

## 2015-10-10 ENCOUNTER — Ambulatory Visit (HOSPITAL_COMMUNITY): Payer: Medicare Other

## 2015-10-10 ENCOUNTER — Encounter (HOSPITAL_COMMUNITY): Payer: Medicare Other

## 2015-10-10 ENCOUNTER — Observation Stay (HOSPITAL_BASED_OUTPATIENT_CLINIC_OR_DEPARTMENT_OTHER): Payer: Medicare Other

## 2015-10-10 DIAGNOSIS — I129 Hypertensive chronic kidney disease with stage 1 through stage 4 chronic kidney disease, or unspecified chronic kidney disease: Secondary | ICD-10-CM | POA: Diagnosis not present

## 2015-10-10 DIAGNOSIS — W19XXXA Unspecified fall, initial encounter: Secondary | ICD-10-CM

## 2015-10-10 DIAGNOSIS — I951 Orthostatic hypotension: Secondary | ICD-10-CM | POA: Diagnosis not present

## 2015-10-10 DIAGNOSIS — E1122 Type 2 diabetes mellitus with diabetic chronic kidney disease: Secondary | ICD-10-CM | POA: Diagnosis not present

## 2015-10-10 DIAGNOSIS — E119 Type 2 diabetes mellitus without complications: Secondary | ICD-10-CM

## 2015-10-10 DIAGNOSIS — R55 Syncope and collapse: Secondary | ICD-10-CM | POA: Diagnosis not present

## 2015-10-10 DIAGNOSIS — N183 Chronic kidney disease, stage 3 (moderate): Secondary | ICD-10-CM | POA: Diagnosis not present

## 2015-10-10 DIAGNOSIS — Z66 Do not resuscitate: Secondary | ICD-10-CM | POA: Diagnosis not present

## 2015-10-10 DIAGNOSIS — E1151 Type 2 diabetes mellitus with diabetic peripheral angiopathy without gangrene: Secondary | ICD-10-CM | POA: Diagnosis not present

## 2015-10-10 DIAGNOSIS — S0012XA Contusion of left eyelid and periocular area, initial encounter: Secondary | ICD-10-CM | POA: Diagnosis not present

## 2015-10-10 LAB — GLUCOSE, CAPILLARY
GLUCOSE-CAPILLARY: 130 mg/dL — AB (ref 65–99)
GLUCOSE-CAPILLARY: 138 mg/dL — AB (ref 65–99)
GLUCOSE-CAPILLARY: 173 mg/dL — AB (ref 65–99)
GLUCOSE-CAPILLARY: 122 mg/dL — AB (ref 65–99)
Glucose-Capillary: 127 mg/dL — ABNORMAL HIGH (ref 65–99)

## 2015-10-10 LAB — COMPREHENSIVE METABOLIC PANEL
ALBUMIN: 2.9 g/dL — AB (ref 3.5–5.0)
ALK PHOS: 80 U/L (ref 38–126)
ALT: 18 U/L (ref 17–63)
ANION GAP: 10 (ref 5–15)
AST: 27 U/L (ref 15–41)
BILIRUBIN TOTAL: 0.8 mg/dL (ref 0.3–1.2)
BUN: 12 mg/dL (ref 6–20)
CO2: 26 mmol/L (ref 22–32)
CREATININE: 1.39 mg/dL — AB (ref 0.61–1.24)
Calcium: 8.4 mg/dL — ABNORMAL LOW (ref 8.9–10.3)
Chloride: 102 mmol/L (ref 101–111)
GFR calc Af Amer: 48 mL/min — ABNORMAL LOW (ref >60)
GFR calc non Af Amer: 42 mL/min — ABNORMAL LOW (ref >60)
Glucose, Bld: 167 mg/dL — ABNORMAL HIGH (ref 65–99)
POTASSIUM: 5.1 mmol/L (ref 3.5–5.1)
SODIUM: 138 mmol/L (ref 135–145)
Total Protein: 5.4 g/dL — ABNORMAL LOW (ref 6.5–8.1)

## 2015-10-10 LAB — CBC WITH DIFFERENTIAL/PLATELET
Basophils Absolute: 0 10*3/uL (ref 0.0–0.1)
Basophils Relative: 0 %
EOS ABS: 0 10*3/uL (ref 0.0–0.7)
Eosinophils Relative: 1 %
HEMATOCRIT: 39.1 % (ref 39.0–52.0)
HEMOGLOBIN: 12.7 g/dL — AB (ref 13.0–17.0)
LYMPHS ABS: 1.1 10*3/uL (ref 0.7–4.0)
Lymphocytes Relative: 13 %
MCH: 32.6 pg (ref 26.0–34.0)
MCHC: 32.5 g/dL (ref 30.0–36.0)
MCV: 100.5 fL — ABNORMAL HIGH (ref 78.0–100.0)
MONOS PCT: 7 %
Monocytes Absolute: 0.6 10*3/uL (ref 0.1–1.0)
NEUTROS ABS: 6.7 10*3/uL (ref 1.7–7.7)
NEUTROS PCT: 79 %
Platelets: 180 10*3/uL (ref 150–400)
RBC: 3.89 MIL/uL — AB (ref 4.22–5.81)
RDW: 13.6 % (ref 11.5–15.5)
WBC: 8.4 10*3/uL (ref 4.0–10.5)

## 2015-10-10 LAB — TROPONIN I: Troponin I: 0.04 ng/mL — ABNORMAL HIGH (ref <0.031)

## 2015-10-10 MED ORDER — MAGNESIUM HYDROXIDE 400 MG/5ML PO SUSP
30.0000 mL | Freq: Once | ORAL | Status: AC
  Administered 2015-10-10: 30 mL via ORAL
  Filled 2015-10-10: qty 30

## 2015-10-10 MED ORDER — LINACLOTIDE 145 MCG PO CAPS: 145.0000 ug | ORAL_CAPSULE | Freq: Every day | ORAL | Status: DC

## 2015-10-10 MED ORDER — LINACLOTIDE 145 MCG PO CAPS
290.0000 ug | ORAL_CAPSULE | Freq: Every day | ORAL | Status: DC
Start: 2015-10-11 — End: 2015-10-11
  Administered 2015-10-11: 290 ug via ORAL
  Filled 2015-10-10: qty 2

## 2015-10-10 MED ORDER — LINACLOTIDE 145 MCG PO CAPS
145.0000 ug | ORAL_CAPSULE | Freq: Every day | ORAL | Status: DC
  Administered 2015-10-10: 145 ug via ORAL
  Filled 2015-10-10: qty 1

## 2015-10-10 NOTE — Evaluation (Signed)
Physical Therapy Evaluation Patient Details Name: Andrew Weaver MRN: [id number] DOB: Aug 01, 1919 Today's Date: 10/10/2015   History of Present Illness  Pt is a 82 y/o M who was leaving dinner when his legs got weak and suddenly he fell forward and hit his head w/ resultant large periorbital hematoma.  Pt's PMH includes PVD, DM, CAD.  Clinical Impression  Pt admitted with above diagnosis. Pt currently with functional limitations due to the deficits listed below (see PT Problem List). Mr. Ellerbe lives at home w/ his wife whom is dependent on his assist for ADLs.  He currently requires min assist at times while ambulating due to impaired balance and will benefit from additional therapy at SNF.  See general notes below for more detail about pt's living situation. Pt will benefit from skilled PT to increase their independence and safety with mobility to allow discharge to the venue listed below.      Follow Up Recommendations SNF;Supervision for mobility/OOB    Equipment Recommendations  Rolling walker with 5" wheels    Recommendations for Other Services       Precautions / Restrictions Precautions Precautions: Fall Restrictions Weight Bearing Restrictions: No      Mobility  Bed Mobility Overal bed mobility: Needs Assistance Bed Mobility: Supine to Sit     Supine to sit: Min guard;HOB elevated     General bed mobility comments: Increased time, HOB elevated, and use of bed rail.  Transfers Overall transfer level: Needs assistance Equipment used: Rolling walker (2 wheeled) Transfers: Sit to/from Stand Sit to Stand: Min assist         General transfer comment: Min assist to steady as pt stands.  Cues for proper technique using RW.  Denies dizziness  Ambulation/Gait Ambulation/Gait assistance: Min assist Ambulation Distance (Feet): 100 Feet Assistive device: Rolling walker (2 wheeled) Gait Pattern/deviations: Step-through pattern;Decreased stride length;Drifts  right/left;Decreased weight shift to right   Gait velocity interpretation: Below normal speed for age/gender General Gait Details: Pt begins to drift to Lt and Rt when challenged w/ balance activities while ambulating (specifically head turns).  Cues for proper use of RW.  Pt must stop to talk while walking.  Favoring Lt LE, dec weight shift to Rt.  Stairs            Wheelchair Mobility    Modified Rankin (Stroke Patients Only)       Balance Overall balance assessment: Needs assistance Sitting-balance support: Bilateral upper extremity supported;Feet supported Sitting balance-Leahy Scale: Fair     Standing balance support: Bilateral upper extremity supported;During functional activity Standing balance-Leahy Scale: Fair                               Pertinent Vitals/Pain Pain Assessment: No/denies pain    Home Living Family/patient expects to be discharged to:: Skilled nursing facility Living Arrangements: Spouse/significant other Available Help at Discharge: Family;Available PRN/intermittently (daughter and grandaughter) Type of Home: Independent living facility Home Access: Level entry     Home Layout: One level Home Equipment: Walker - 4 wheels;Toilet riser (3 wheeled walker and rollator) Additional Comments: Lives in [place] at [place] w/ his wife, he assists wife w/ donning/doffing socks and shoes    Prior Function Level of Independence: Independent         Comments: Pt has been refusing cane and RW. Ind w/ all ADLs and is still driving     Hand Dominance  Extremity/Trunk Assessment   Upper Extremity Assessment: Overall WFL for tasks assessed           Lower Extremity Assessment: RLE deficits/detail RLE Deficits / Details: strength grossly 4/5    Cervical / Trunk Assessment: Kyphotic  Communication   Communication: No difficulties  Cognition Arousal/Alertness: Awake/alert Behavior During Therapy: WFL for  tasks assessed/performed Overall Cognitive Status: Impaired/Different from baseline Area of Impairment: Safety/judgement         Safety/Judgement: Decreased awareness of safety          General Comments General comments (skin integrity, edema, etc.): Pt reports he has been thinking about how him and his wife will need to move within [facility] to receive increased level of assist.  He is concerned that his wife will not want to move, and pt seems distraught that he would likely not be able to keep his car.  Discussed w/ pt and daughter that pt will likely need ST SNF at d/c and encouraged him to begin creating a plan for him and his wife to receive more assist.    Exercises        Assessment/Plan    PT Assessment Patient needs continued PT services  PT Diagnosis Difficulty walking   PT Problem List Decreased strength;Decreased activity tolerance;Decreased balance;Decreased mobility;Decreased cognition;Decreased knowledge of use of DME;Decreased safety awareness  PT Treatment Interventions DME instruction;Gait training;Functional mobility training;Therapeutic activities;Therapeutic exercise;Balance training;Neuromuscular re-education;Cognitive remediation;Patient/family education   PT Goals (Current goals can be found in the Care Plan section) Acute Rehab PT Goals Patient Stated Goal: to figure out long term living situation PT Goal Formulation: With patient/family Time For Goal Achievement: 10/24/15 Potential to Achieve Goals: Good    Frequency Min 2X/week   Barriers to discharge Decreased caregiver support pt lives alone w/ wife who is dependent on pt for assist    Co-evaluation               End of Session Equipment Utilized During Treatment: Gait belt Activity Tolerance: Patient tolerated treatment well;Patient limited by fatigue Patient left: in chair;with call bell/phone within reach;with chair alarm set;with family/visitor present Nurse Communication: Mobility  status    Functional Assessment Tool Used: Clinical Judgement Functional Limitation: Mobility: Walking and moving around Mobility: Walking and Moving Around Current Status [phone]): At least 1 percent but less than 20 percent impaired, limited or restricted Mobility: Walking and Moving Around Goal Status [phone]): At least 1 percent but less than 20 percent impaired, limited or restricted    Time: [id number] PT Time Calculation (min) (ACUTE ONLY): 26 min   Charges:   PT Evaluation $PT Eval Moderate Complexity: 1 Procedure PT Treatments $Gait Training: 8-22 mins   PT G Codes:   PT G-Codes **NOT FOR INPATIENT CLASS** Functional Assessment Tool Used: Clinical Judgement Functional Limitation: Mobility: Walking and moving around Mobility: Walking and Moving Around Current Status [id number]): At least 1 percent but less than 20 percent impaired, limited or restricted Mobility: Walking and Moving Around Goal Status [phone]): At least 1 percent but less than 20 percent impaired, limited or restricted    Joslyn Hy PT, DPT [phone] Pager: [phone]  10/10/2015, 3:53 PM

## 2015-10-10 NOTE — Progress Notes (Signed)
*  PRELIMINARY RESULTS* Vascular Ultrasound Carotid Duplex (Doppler) has been completed.  Preliminary findings: Bilateral: No significant (1-39%) ICA stenosis. Antegrade vertebral flow.    Landry Mellow, RDMS, RVT  10/10/2015, 4:49 PM

## 2015-10-10 NOTE — NC FL2 (Signed)
[place] LEVEL OF CARE SCREENING TOOL     IDENTIFICATION  Patient Name: Andrew Weaver Birthdate: [id number] Sex: male Admission Date (Current Location): 10/09/2015  [facility] and [place] Number:  Herbalist and Address:  [facility] [employer], [place] [address], [place], [place] [address]      Provider Number: [id number]  Attending Physician Name and Address:  Verlee Monte, MD  Relative Name and Phone Number:  Marquita Palms    Current Level of Care: Hospital Recommended Level of Care: [place] Prior Approval Number:    Date Approved/Denied:   PASRR Number:  [id number]  Discharge Plan: SNF    Current Diagnoses: Patient Active Problem List   Diagnosis Date Noted  . Syncope 10/09/2015  . Fall 10/09/2015  . Type II or unspecified type diabetes mellitus with neurological manifestations, not stated as uncontrolled 11/10/2013  . Polyneuropathy in diabetes(357.2) 11/10/2013  . Coronary atherosclerosis of native coronary artery 11/10/2013  . Mixed hyperlipidemia 11/10/2013  . Hypertension     Orientation RESPIRATION BLADDER Height & Weight    Self, Time, Situation, Place  Normal Continent 6\' 1"  (185.4 cm) 176 lbs.  BEHAVIORAL SYMPTOMS/MOOD NEUROLOGICAL BOWEL NUTRITION STATUS  Other (Comment) (appropriate)  (n/a) Continent Diet (Regular)  AMBULATORY STATUS COMMUNICATION OF NEEDS Skin   Limited Assist (has a walker) Verbally Normal                       Personal Care Assistance Level of Assistance  Bathing, Feeding, Dressing Bathing Assistance: Limited assistance Feeding assistance: Independent Dressing Assistance: Limited assistance     Functional Limitations Info  Hearing, Sight, Speech Sight Info: Adequate Hearing Info: Impaired (Hard of Hearing) Speech Info: Adequate    SPECIAL CARE FACTORS FREQUENCY  PT (By licensed PT), OT (By licensed OT)     PT Frequency: 5x/week OT Frequency: 5x/week             Contractures Contractures Info: Not present    Additional Factors Info  Code Status, Allergies, Insulin Sliding Scale Code Status Info: DNR Allergies Info: Lipitor   Insulin Sliding Scale Info: 0-9 units 3x/day       Current Medications (10/10/2015):  This is the current hospital active medication list Current Facility-Administered Medications  Medication Dose Route Frequency Provider Last Rate Last Dose  . aspirin EC tablet 81 mg  81 mg Oral Daily Gennaro Africa, MD   81 mg at 10/10/15 0836  . clopidogrel (PLAVIX) tablet 75 mg  75 mg Oral Daily Gennaro Africa, MD   75 mg at 10/10/15 0837  . enoxaparin (LOVENOX) injection 40 mg  40 mg Subcutaneous Daily Gennaro Africa, MD   40 mg at 10/10/15 0839  . guaiFENesin-codeine 100-10 MG/5ML solution 5 mL  5 mL Oral QID PRN Gennaro Africa, MD      . losartan (COZAAR) tablet 50 mg  50 mg Oral Daily Edwin Dada, MD   50 mg at 10/10/15 [id number]   And  . hydrochlorothiazide (MICROZIDE) capsule 12.5 mg  12.5 mg Oral Daily Edwin Dada, MD      . insulin aspart (novoLOG) injection 0-9 Units  0-9 Units Subcutaneous TID WC Gennaro Africa, MD   1 Units at 10/10/15 [phone]  . insulin aspart (novoLOG) injection 5 Units  5 Units Subcutaneous TID WC Gennaro Africa, MD   5 Units at 10/10/15 [phone]  . insulin glargine (LANTUS) injection 20 Units  20 Units Subcutaneous Daily Gennaro Africa, MD  20 Units at 10/10/15 1057  . nitroGLYCERIN (NITROSTAT) SL tablet 0.4 mg  0.4 mg Sublingual Q5 min PRN Gennaro Africa, MD         Discharge Medications: Please see discharge summary for a list of discharge medications.  Relevant Imaging Results:  Relevant Lab Results:   Additional [place] Intern, [id number]

## 2015-10-10 NOTE — Care Management Note (Addendum)
Case Management Note  Patient Details  Name: Andrew Weaver MRN: [id number] Date of Birth: 01/29/1919  Subjective/Objective: Pt admitted for Syncopal Episode. Pt is from [place] at [facility]. Pt lives with wife. PT/OT consulted for recommendations of higher level of care if needed. Pt has DME RW.                    Action/Plan: CM will continue to monitor for additional needs.    Expected Discharge Date:                  Expected Discharge Plan:  Assisted Living / Rest Home  In-House Referral:  Clinical Social Work  Discharge planning Services  CM Consult  Post Acute Care Choice:    Choice offered to:     DME Arranged:    DME Agency:     HH Arranged:    HH Agency:     Status of Service:  In process, will continue to follow  Medicare Important Message Given:    Date Medicare IM Given:    Medicare IM give by:    Date Additional Medicare IM Given:    Additional Medicare Important Message give by:     If discussed at [place] of Stay Meetings, dates discussed:    Additional Comments: 1123 10-11-15 Jacqlyn Krauss, RN, BSN [phone] Pt is planned for d/c today. CSW assisting pt with disposition needs. No further needs from CM at this time.   Bethena Roys, RN 10/10/2015, 9:56 AM

## 2015-10-10 NOTE — Progress Notes (Addendum)
[facility] PROGRESS NOTE   Andrew Weaver [id number] DOB: [birth year] DOA: 10/09/2015 PCP: Mathews Argyle, MD  HPI/Subjective: Feels okay, very concerned about his medications. Denies any complaints.  Assessment/Plan: Active Problems:   Syncope   Fall   Fall Likely mechanical. DDx: TIA, hypoglycemia ( did not take insulin prior but did not check BS after fall), orthostatic hypotension. PT consult in am. Will check orthostatic.  Cont aspirin, will obtain MRI Denies any loss of consciousness, keep on telemetry. 2-D echocardiogram and carotid duplex.  DM 2 Cont home meds with glycemic protocol. Low dose correction.   HTN Continue home medications  AKI vs. CKD satge 3 Likely CKD. Will monitor  Code Status: DNR Family Communication: Plan discussed with the patient. Disposition Plan: Remains inpatient Diet: Diet regular Room service appropriate?: Yes; Fluid consistency:: Thin  Consultants:  None  Procedures:  None  Antibiotics:  None   Objective: Filed Vitals:   10/10/15 0414 10/10/15 0416  BP: 115/71 106/59  Pulse: 64 74  Temp:    Resp:      Intake/Output Summary (Last 24 hours) at 10/10/15 1121 Last data filed at 10/10/15 0900  Gross per 24 hour  Intake    360 ml  Output    525 ml  Net   -165 ml   Filed Weights   10/09/15 2335 10/10/15 0419  Weight: 80.332 kg (177 lb 1.6 oz) 80.06 kg (176 lb 8 oz)    Exam: General: Alert and awake, oriented x3, not in any acute distress. HEENT: anicteric sclera, pupils reactive to light and accommodation, EOMI CVS: S1-S2 clear, no murmur rubs or gallops Chest: clear to auscultation bilaterally, no wheezing, rales or rhonchi Abdomen: soft nontender, nondistended, normal bowel sounds, no organomegaly Extremities: no cyanosis, clubbing or edema noted bilaterally Neuro: Cranial nerves II-XII intact, no focal neurological deficits  Data Reviewed: Basic Metabolic Panel:  Recent Labs Lab  10/09/15 1939 10/10/15 0248  NA 138 138  K 4.1 5.1  CL 105 102  CO2 26 26  GLUCOSE 115* 167*  BUN 10 12  CREATININE 1.31* 1.39*  CALCIUM 8.5* 8.4*   Liver Function Tests:  Recent Labs Lab 10/09/15 1939 10/10/15 0248  AST 33 27  ALT 22 18  ALKPHOS 88 80  BILITOT 0.7 0.8  PROT 5.9* 5.4*  ALBUMIN 3.2* 2.9*   No results for input(s): LIPASE, AMYLASE in the last 168 hours. No results for input(s): AMMONIA in the last 168 hours. CBC:  Recent Labs Lab 10/09/15 1939 10/10/15 0248  WBC 9.7 8.4  NEUTROABS 8.3* 6.7  HGB 13.6 12.7*  HCT 41.2 39.1  MCV 100.0 100.5*  PLT 182 180   Cardiac Enzymes:  Recent Labs Lab 10/09/15 1939 10/10/15 0248  TROPONINI 0.03 0.04*   BNP (last 3 results) No results for input(s): BNP in the last 8760 hours.  ProBNP (last 3 results) No results for input(s): PROBNP in the last 8760 hours.  CBG:  Recent Labs Lab 10/09/15 2155 10/09/15 2349 10/10/15 0411 10/10/15 0736  GLUCAP 111* 135* 130* 127*    Micro No results found for this or any previous visit (from the past 240 hour(s)).   Studies: Ct Head Wo Contrast  10/09/2015  CLINICAL DATA:  Status post fall with a blow to the head today. Initial encounter. EXAM: CT HEAD WITHOUT CONTRAST CT CERVICAL SPINE WITHOUT CONTRAST TECHNIQUE: Multidetector CT imaging of the head and cervical spine was performed following the standard protocol without intravenous contrast. Multiplanar CT image reconstructions of the  cervical spine were also generated. COMPARISON:  None. FINDINGS: CT HEAD FINDINGS A large hematoma is seen about the left eye without underlying fracture. The globes are intact. Orbital fat is clear. The patient is status post bilateral lens extraction. There is cortical atrophy and chronic microvascular ischemic change. No evidence of acute intracranial abnormality including hemorrhage, infarct, mass lesion, mass effect, midline shift or abnormal extra-axial fluid collection is  identified. No hydrocephalus or pneumocephalus. The calvarium is intact. CT CERVICAL SPINE FINDINGS No fracture or malalignment of the cervical spine is identified. There is multilevel facet arthropathy. Loss of disc space height appears worst at C5-6. Paraspinous structures are unremarkable. IMPRESSION: Large hematoma about the left eye without underlying fracture. No acute intracranial abnormality or acute abnormality cervical spine. Atrophy and chronic microvascular ischemic change. Cervical spondylosis. Electronically Signed   By: Inge Rise M.D.   On: 10/09/2015 19:19   Ct Cervical Spine Wo Contrast  10/09/2015  CLINICAL DATA:  Status post fall with a blow to the head today. Initial encounter. EXAM: CT HEAD WITHOUT CONTRAST CT CERVICAL SPINE WITHOUT CONTRAST TECHNIQUE: Multidetector CT imaging of the head and cervical spine was performed following the standard protocol without intravenous contrast. Multiplanar CT image reconstructions of the cervical spine were also generated. COMPARISON:  None. FINDINGS: CT HEAD FINDINGS A large hematoma is seen about the left eye without underlying fracture. The globes are intact. Orbital fat is clear. The patient is status post bilateral lens extraction. There is cortical atrophy and chronic microvascular ischemic change. No evidence of acute intracranial abnormality including hemorrhage, infarct, mass lesion, mass effect, midline shift or abnormal extra-axial fluid collection is identified. No hydrocephalus or pneumocephalus. The calvarium is intact. CT CERVICAL SPINE FINDINGS No fracture or malalignment of the cervical spine is identified. There is multilevel facet arthropathy. Loss of disc space height appears worst at C5-6. Paraspinous structures are unremarkable. IMPRESSION: Large hematoma about the left eye without underlying fracture. No acute intracranial abnormality or acute abnormality cervical spine. Atrophy and chronic microvascular ischemic change.  Cervical spondylosis. Electronically Signed   By: Inge Rise M.D.   On: 10/09/2015 19:19    Scheduled Meds: . aspirin EC  81 mg Oral Daily  . clopidogrel  75 mg Oral Daily  . enoxaparin (LOVENOX) injection  40 mg Subcutaneous Daily  . losartan  50 mg Oral Daily   And  . hydrochlorothiazide  12.5 mg Oral Daily  . insulin aspart  0-9 Units Subcutaneous TID WC  . insulin aspart  5 Units Subcutaneous TID WC  . insulin glargine  20 Units Subcutaneous Daily   Continuous Infusions:      Time spent: 35 minutes    [facility] A  [facility] Pager [phone] If 7PM-7AM, please contact night-coverage at [email or web address], password [facility] 10/10/2015, 11:21 AM  LOS: 1 day

## 2015-10-10 NOTE — Clinical Social Work Note (Signed)
Clinical Social Work Assessment  Patient Details  Name: Andrew Weaver MRN: [id number] Date of Birth: 08/10/1919  Date of referral:  10/10/15               Reason for consult:  Facility Placement, Discharge Planning                Permission sought to share information with:  Facility Sport and exercise psychologist, Family Supports Permission granted to share information::  Yes, Verbal Permission Granted  Name::     Marquita Palms  Agency::  [facility]  Relationship::  Daughter  Contact Information:  [phone]  Housing/Transportation Living arrangements for the past 2 months:  [place] of Information:  Patient Patient Interpreter Needed:  None Criminal Activity/Legal Involvement Pertinent to Current Situation/Hospitalization:  No - Comment as needed Significant Relationships:  Adult Children, Other Family Members (Grandchildren) Lives with:  Spouse Do you feel safe going back to the place where you live?  Yes Need for family participation in patient care:  Yes (Comment)  Care giving concerns:  Patient lives at an [place]) with his wife. Patient may need a greater level of care following discharge.   Social Worker assessment / plan:  BSW intern met with patient at bedside to complete assessment. Patient stated that he lived at [facility] in an [facility]. Patient stated that he lives in a house with his wife. The patient plans to return to the facility at discharge. Patient stated that his daughter lives in [place] and is very involved in his care. Patient also stated that he has grandchildren and great grandchildren that are also a support. PT has been ordered to evaluate if patient will need a SNF for short-term rehab. BSW intern will continue to follow and assist as needed.  Employment status:  Retired Forensic scientist:  [employer] PT Recommendations:  [place] / Referral to  community resources:  [place]  Patient/Family's Response to care:  Patient stated that his stay at [employer] [facility] has been a, "wonderful experience."   Patient/Family's Understanding of and Emotional Response to Diagnosis, Current Treatment, and Prognosis: Patient seemed to have a very good understanding for his reason of admission. Patient was very cooperative and pleasant to work with.   Emotional Assessment Appearance:  Appears stated age Attitude/Demeanor/Rapport:  Other (Pleasant, Appropriate) Affect (typically observed):  Pleasant, Appropriate, Calm Orientation:  Oriented to Self, Oriented to Place, Oriented to  Time, Oriented to Situation Alcohol / Substance use:  Not Applicable Psych involvement (Current and /or in the community):  No (Comment)  Discharge Needs  Concerns to be addressed:  No discharge needs identified Readmission within the last 30 days:  No Current discharge risk:  None Barriers to Discharge:  Continued Medical Work up   [employer], [id number]

## 2015-10-10 NOTE — Care Management Obs Status (Signed)
[place] NOTIFICATION   Patient Details  Name: Andrew Weaver MRN: [id number] Date of Birth: 1919-05-03   Medicare Observation Status Notification Given:  Yes    Erenest Rasher, RN 10/10/2015, 12:38 PM

## 2015-10-11 ENCOUNTER — Encounter: Payer: Self-pay | Admitting: Gastroenterology

## 2015-10-11 ENCOUNTER — Observation Stay (HOSPITAL_COMMUNITY): Payer: Medicare Other

## 2015-10-11 ENCOUNTER — Ambulatory Visit (HOSPITAL_BASED_OUTPATIENT_CLINIC_OR_DEPARTMENT_OTHER): Payer: Medicare Other

## 2015-10-11 DIAGNOSIS — S0990XA Unspecified injury of head, initial encounter: Secondary | ICD-10-CM | POA: Diagnosis not present

## 2015-10-11 DIAGNOSIS — W19XXXA Unspecified fall, initial encounter: Secondary | ICD-10-CM | POA: Diagnosis not present

## 2015-10-11 DIAGNOSIS — G459 Transient cerebral ischemic attack, unspecified: Secondary | ICD-10-CM | POA: Diagnosis not present

## 2015-10-11 DIAGNOSIS — E1151 Type 2 diabetes mellitus with diabetic peripheral angiopathy without gangrene: Secondary | ICD-10-CM | POA: Diagnosis not present

## 2015-10-11 DIAGNOSIS — N183 Chronic kidney disease, stage 3 (moderate): Secondary | ICD-10-CM | POA: Diagnosis not present

## 2015-10-11 DIAGNOSIS — R55 Syncope and collapse: Secondary | ICD-10-CM | POA: Diagnosis not present

## 2015-10-11 DIAGNOSIS — Z66 Do not resuscitate: Secondary | ICD-10-CM | POA: Diagnosis not present

## 2015-10-11 DIAGNOSIS — S0012XA Contusion of left eyelid and periocular area, initial encounter: Secondary | ICD-10-CM | POA: Diagnosis not present

## 2015-10-11 DIAGNOSIS — I129 Hypertensive chronic kidney disease with stage 1 through stage 4 chronic kidney disease, or unspecified chronic kidney disease: Secondary | ICD-10-CM | POA: Diagnosis not present

## 2015-10-11 DIAGNOSIS — I10 Essential (primary) hypertension: Secondary | ICD-10-CM | POA: Diagnosis not present

## 2015-10-11 DIAGNOSIS — E1122 Type 2 diabetes mellitus with diabetic chronic kidney disease: Secondary | ICD-10-CM | POA: Diagnosis not present

## 2015-10-11 LAB — BASIC METABOLIC PANEL
Anion gap: 10 (ref 5–15)
BUN: 15 mg/dL (ref 6–20)
CHLORIDE: 102 mmol/L (ref 101–111)
CO2: 26 mmol/L (ref 22–32)
Calcium: 8.4 mg/dL — ABNORMAL LOW (ref 8.9–10.3)
Creatinine, Ser: 1.42 mg/dL — ABNORMAL HIGH (ref 0.61–1.24)
GFR calc Af Amer: 47 mL/min — ABNORMAL LOW (ref >60)
GFR calc non Af Amer: 41 mL/min — ABNORMAL LOW (ref >60)
GLUCOSE: 106 mg/dL — AB (ref 65–99)
POTASSIUM: 4.2 mmol/L (ref 3.5–5.1)
Sodium: 138 mmol/L (ref 135–145)

## 2015-10-11 LAB — CBC
HEMATOCRIT: 39.7 % (ref 39.0–52.0)
Hemoglobin: 13.1 g/dL (ref 13.0–17.0)
MCH: 33.2 pg (ref 26.0–34.0)
MCHC: 33 g/dL (ref 30.0–36.0)
MCV: 100.8 fL — AB (ref 78.0–100.0)
Platelets: 183 10*3/uL (ref 150–400)
RBC: 3.94 MIL/uL — ABNORMAL LOW (ref 4.22–5.81)
RDW: 13.4 % (ref 11.5–15.5)
WBC: 7 10*3/uL (ref 4.0–10.5)

## 2015-10-11 LAB — GLUCOSE, CAPILLARY
GLUCOSE-CAPILLARY: 160 mg/dL — AB (ref 65–99)
Glucose-Capillary: 113 mg/dL — ABNORMAL HIGH (ref 65–99)
Glucose-Capillary: 171 mg/dL — ABNORMAL HIGH (ref 65–99)

## 2015-10-11 NOTE — Plan of Care (Signed)
Problem: Education: Goal: Knowledge of patient specific risk factors addressed and post discharge goals established will improve Outcome: Progressing Provide Stroke Education to Pt/Family

## 2015-10-11 NOTE — Progress Notes (Signed)
  Echocardiogram 2D Echocardiogram has been performed.  Jennette Dubin 10/11/2015, 2:15 PM

## 2015-10-11 NOTE — Discharge Summary (Signed)
Physician Discharge Summary  Andrew Weaver [id number] DOB: May 05, 1919 DOA: 10/09/2015  PCP: Mathews Argyle, MD  Admit date: 10/09/2015 Discharge date: 10/11/2015  Time spent: 40 minutes  Recommendations for Outpatient Follow-up:  1. Follow-up with primary care physician within one week, discharged to [facility].   Discharge Diagnoses:  Active Problems:   Syncope   Fall   Discharge Condition: Stable  Diet recommendation: Heart healthy  Filed Weights   10/09/15 2335 10/10/15 0419 10/11/15 0434  Weight: 80.332 kg (177 lb 1.6 oz) 80.06 kg (176 lb 8 oz) 79.379 kg (175 lb)    History of present illness:  Patient is a 82 yo pleasant male with history of CAD s/p stent#2 in LAD who came with cc of fall. He said he was leaving to get dinner when his legs got weak suddenly and he felt forward hitting his head against the door leaving him with large periorbital hematoma. He was able to stand by himself in a few minutes. He denied any sensory abnormalities or other focal motor deficits. He denied chest pain, dyspnea, cough, N/V/D/C/abd pain, dysuria. He denied vertigo or dizziness. He said he is supposed to use a walker but he does not like it and he can walk on his own. From his records, it seems that he had issues with imbalance in the past as well.   Hospital Course:    Fall Likely mechanical. DDx: TIA, hypoglycemia ( did not take insulin prior but did not check BS after fall), orthostatic hypotension. PT consult in am. Will check orthostatic.  Patient is on aspirin and Plavix, CT scan showed no intracranial abnormality, both continued. MRI obtained and showed no abnormalities. Denies any loss of consciousness, telemetry did not show any arrhythmias Carotid duplex without stenosis, no further workup, patient fall is likely mechanical.  DM 2 Cont home meds with glycemic protocol.Was on carbohydrate modified diet and SSI. On discharge home medication continued.    HTN Continue home medications  AKI vs. CKD satge 3 Likely CKD. Will monitor  Constipation This is resolved after milk of magnesia, patient takes lessons as at home. Continued.  Procedures:  Carotid duplex preliminary report showed no significant stenosis  Consultations: None  Discharge Exam: Filed Vitals:   10/10/15 2021 10/11/15 0434  BP: 124/71 127/69  Pulse: 73 80  Temp: 98.4 F (36.9 C) 98.1 F (36.7 C)  Resp: 19 17   General: Alert and awake, oriented x3, not in any acute distress. HEENT: There is resolving hematoma above his left eyebrow. CVS: S1-S2 clear, no murmur rubs or gallops Chest: clear to auscultation bilaterally, no wheezing, rales or rhonchi Abdomen: soft nontender, nondistended, normal bowel sounds, no organomegaly Extremities: no cyanosis, clubbing or edema noted bilaterally Neuro: Cranial nerves II-XII intact, no focal neurological deficits   Discharge Instructions   Discharge Instructions    Diet - low sodium heart healthy    Complete by:  As directed      Increase activity slowly    Complete by:  As directed           Current Discharge Medication List    CONTINUE these medications which have NOT CHANGED   Details  aspirin 81 MG tablet Take 1 tablet (81 mg total) by mouth daily. Qty: 30 tablet    atorvastatin (LIPITOR) 10 MG tablet Take 10 mg by mouth daily.    clopidogrel (PLAVIX) 75 MG tablet Take 75 mg by mouth once.     insulin glargine (LANTUS) 100 UNIT/ML injection Inject  20 Units into the skin daily.     insulin regular (NOVOLIN R,HUMULIN R) 100 units/mL injection Inject 5-10 Units into the skin 3 (three) times daily before meals. 5 units if blood sugar under 150, 10 units if over 200 - estimates in between 150 and 200    LINZESS 290 MCG CAPS capsule Take 290 mcg by mouth daily. Refills: 6    losartan (COZAAR) 100 MG tablet Take 100 mg by mouth daily.    nitroGLYCERIN (NITROSTAT) 0.4 MG SL tablet Place 1 tablet (0.4 mg  total) under the tongue every 5 (five) minutes as needed for chest pain. Qty: 25 tablet, Refills: 5    PREVIDENT 5000 BOOSTER PLUS 1.1 % PSTE Use as directed 1 application in the mouth or throat daily as needed (dental care).     tamsulosin (FLOMAX) 0.4 MG CAPS capsule Take 0.4 mg by mouth daily.       STOP taking these medications     guaiFENesin-codeine 100-10 MG/5ML syrup        Allergies  Allergen Reactions  . Lipitor [Atorvastatin]     fatigue      The results of significant diagnostics from this hospitalization (including imaging, microbiology, ancillary and laboratory) are listed below for reference.    Significant Diagnostic Studies: Ct Head Wo Contrast  10/09/2015  CLINICAL DATA:  Status post fall with a blow to the head today. Initial encounter. EXAM: CT HEAD WITHOUT CONTRAST CT CERVICAL SPINE WITHOUT CONTRAST TECHNIQUE: Multidetector CT imaging of the head and cervical spine was performed following the standard protocol without intravenous contrast. Multiplanar CT image reconstructions of the cervical spine were also generated. COMPARISON:  None. FINDINGS: CT HEAD FINDINGS A large hematoma is seen about the left eye without underlying fracture. The globes are intact. Orbital fat is clear. The patient is status post bilateral lens extraction. There is cortical atrophy and chronic microvascular ischemic change. No evidence of acute intracranial abnormality including hemorrhage, infarct, mass lesion, mass effect, midline shift or abnormal extra-axial fluid collection is identified. No hydrocephalus or pneumocephalus. The calvarium is intact. CT CERVICAL SPINE FINDINGS No fracture or malalignment of the cervical spine is identified. There is multilevel facet arthropathy. Loss of disc space height appears worst at C5-6. Paraspinous structures are unremarkable. IMPRESSION: Large hematoma about the left eye without underlying fracture. No acute intracranial abnormality or acute  abnormality cervical spine. Atrophy and chronic microvascular ischemic change. Cervical spondylosis. Electronically Signed   By: Inge Rise M.D.   On: 10/09/2015 19:19   Ct Cervical Spine Wo Contrast  10/09/2015  CLINICAL DATA:  Status post fall with a blow to the head today. Initial encounter. EXAM: CT HEAD WITHOUT CONTRAST CT CERVICAL SPINE WITHOUT CONTRAST TECHNIQUE: Multidetector CT imaging of the head and cervical spine was performed following the standard protocol without intravenous contrast. Multiplanar CT image reconstructions of the cervical spine were also generated. COMPARISON:  None. FINDINGS: CT HEAD FINDINGS A large hematoma is seen about the left eye without underlying fracture. The globes are intact. Orbital fat is clear. The patient is status post bilateral lens extraction. There is cortical atrophy and chronic microvascular ischemic change. No evidence of acute intracranial abnormality including hemorrhage, infarct, mass lesion, mass effect, midline shift or abnormal extra-axial fluid collection is identified. No hydrocephalus or pneumocephalus. The calvarium is intact. CT CERVICAL SPINE FINDINGS No fracture or malalignment of the cervical spine is identified. There is multilevel facet arthropathy. Loss of disc space height appears worst at C5-6. Paraspinous structures  are unremarkable. IMPRESSION: Large hematoma about the left eye without underlying fracture. No acute intracranial abnormality or acute abnormality cervical spine. Atrophy and chronic microvascular ischemic change. Cervical spondylosis. Electronically Signed   By: Inge Rise M.D.   On: 10/09/2015 19:19    Microbiology: No results found for this or any previous visit (from the past 240 hour(s)).   Labs: Basic Metabolic Panel:  Recent Labs Lab 10/09/15 1939 10/10/15 0248 10/11/15 0456  NA 138 138 138  K 4.1 5.1 4.2  CL 105 102 102  CO2 26 26 26   GLUCOSE 115* 167* 106*  BUN 10 12 15   CREATININE 1.31*  1.39* 1.42*  CALCIUM 8.5* 8.4* 8.4*   Liver Function Tests:  Recent Labs Lab 10/09/15 1939 10/10/15 0248  AST 33 27  ALT 22 18  ALKPHOS 88 80  BILITOT 0.7 0.8  PROT 5.9* 5.4*  ALBUMIN 3.2* 2.9*   No results for input(s): LIPASE, AMYLASE in the last 168 hours. No results for input(s): AMMONIA in the last 168 hours. CBC:  Recent Labs Lab 10/09/15 1939 10/10/15 0248 10/11/15 0456  WBC 9.7 8.4 7.0  NEUTROABS 8.3* 6.7  --   HGB 13.6 12.7* 13.1  HCT 41.2 39.1 39.7  MCV 100.0 100.5* 100.8*  PLT 182 180 183   Cardiac Enzymes:  Recent Labs Lab 10/09/15 1939 10/10/15 0248  TROPONINI 0.03 0.04*   BNP: BNP (last 3 results) No results for input(s): BNP in the last 8760 hours.  ProBNP (last 3 results) No results for input(s): PROBNP in the last 8760 hours.  CBG:  Recent Labs Lab 10/10/15 0736 10/10/15 1131 10/10/15 1632 10/10/15 2019 10/11/15 0740  GLUCAP 127* 138* 173* 122* 113*       Signed:  Verlee Monte A MD.  [facility] 10/11/2015, 9:50 AM

## 2015-10-11 NOTE — Progress Notes (Addendum)
RN gave report to charge RN at the Care Unit at [facility]. Pt was given discharge paperwork & educated with his dgtr. CCMD notified of pt being discharged. Pt's IV was removed & pt was sent home to the unit with his dgtr. Pt nor dgtr had any questions & were made aware of future appointments. Hoover Brunette, RN

## 2015-10-11 NOTE — Progress Notes (Signed)
CSW spoke with RN at the Care Unit at [facility] who verified that they could accept pt this pm.  Unit RN to call report.  Daughter to provide transport.  Paperwork sent through [facility] to facility.  Creta Levin, LCSW Evening/ED CSW [id number]

## 2015-10-12 DIAGNOSIS — E119 Type 2 diabetes mellitus without complications: Secondary | ICD-10-CM | POA: Diagnosis not present

## 2015-10-12 DIAGNOSIS — H05232 Hemorrhage of left orbit: Secondary | ICD-10-CM | POA: Diagnosis not present

## 2015-10-12 DIAGNOSIS — R269 Unspecified abnormalities of gait and mobility: Secondary | ICD-10-CM | POA: Diagnosis not present

## 2015-10-12 DIAGNOSIS — I10 Essential (primary) hypertension: Secondary | ICD-10-CM | POA: Diagnosis not present

## 2015-10-15 DIAGNOSIS — R2689 Other abnormalities of gait and mobility: Secondary | ICD-10-CM | POA: Diagnosis not present

## 2015-10-15 DIAGNOSIS — R55 Syncope and collapse: Secondary | ICD-10-CM | POA: Diagnosis not present

## 2015-10-16 DIAGNOSIS — R2689 Other abnormalities of gait and mobility: Secondary | ICD-10-CM | POA: Diagnosis not present

## 2015-10-16 DIAGNOSIS — R55 Syncope and collapse: Secondary | ICD-10-CM | POA: Diagnosis not present

## 2015-10-17 DIAGNOSIS — R55 Syncope and collapse: Secondary | ICD-10-CM | POA: Diagnosis not present

## 2015-10-17 DIAGNOSIS — R2689 Other abnormalities of gait and mobility: Secondary | ICD-10-CM | POA: Diagnosis not present

## 2015-10-18 DIAGNOSIS — R55 Syncope and collapse: Secondary | ICD-10-CM | POA: Diagnosis not present

## 2015-10-18 DIAGNOSIS — R2689 Other abnormalities of gait and mobility: Secondary | ICD-10-CM | POA: Diagnosis not present

## 2015-10-19 DIAGNOSIS — R296 Repeated falls: Secondary | ICD-10-CM | POA: Diagnosis not present

## 2015-10-19 DIAGNOSIS — R2689 Other abnormalities of gait and mobility: Secondary | ICD-10-CM | POA: Diagnosis not present

## 2015-10-19 DIAGNOSIS — E0865 Diabetes mellitus due to underlying condition with hyperglycemia: Secondary | ICD-10-CM | POA: Diagnosis not present

## 2015-10-19 NOTE — ED Provider Notes (Signed)
CSN: [id number]     Arrival date & time 10/09/15  1731 History   First MD Initiated Contact with Patient 10/09/15 1750     Chief Complaint  Patient presents with  . Fall     (Consider location/radiation/quality/duration/timing/severity/associated sxs/prior Treatment) HPI Patient presents to the emergency department with a syncopal event.  Patient states that he is unsure what caused his episode.  The patient states that he is unsure what occurred.  He denies chest pain, shortness of breath, weakness, dizziness, headache, blurred vision, back pain, neck pain, fever, cough, abdominal pain, dysuria, edema, rash, anorexia, nausea or vomiting.  Patient says topics and symmetric condition better or worse Past Medical History  Diagnosis Date  . Diabetes mellitus   . Hypertension   . Hyperlipidemia     goal LDL less than 70  . CAD (coronary artery disease)   . PVD (peripheral vascular disease) ([place])   . BPH (benign prostatic hypertrophy)   . Chronic kidney disease     borderline stage II/stage III   Past Surgical History  Procedure Laterality Date  . Eye surgery    . Coronary angioplasty     Family History  Problem Relation Age of Onset  . Heart disease Mother   . Heart disease Father   . Cancer Father   . Heart disease Sister    Social History  Substance Use Topics  . Smoking status: Former Smoker    Quit date: 09/17/1991  . Smokeless tobacco: Never Used  . Alcohol Use: Yes     Comment: 7    Review of Systems  All other systems negative except as documented in the HPI. All pertinent positives and negatives as reviewed in the HPI.  Allergies  Lipitor  Home Medications   Prior to Admission medications   Medication Sig Start Date End Date Taking? Authorizing Provider  aspirin 81 MG tablet Take 1 tablet (81 mg total) by mouth daily. 05/27/14  Yes Jettie Booze, MD  atorvastatin (LIPITOR) 10 MG tablet Take 10 mg by mouth daily.   Yes Historical Provider, MD   clopidogrel (PLAVIX) 75 MG tablet Take 75 mg by mouth once.  08/23/13  Yes Historical Provider, MD  insulin glargine (LANTUS) 100 UNIT/ML injection Inject 20 Units into the skin daily.    Yes Historical Provider, MD  insulin regular (NOVOLIN R,HUMULIN R) 100 units/mL injection Inject 5-10 Units into the skin 3 (three) times daily before meals. 5 units if blood sugar under 150, 10 units if over 200 - estimates in between 150 and 200   Yes Historical Provider, MD  LINZESS 290 MCG CAPS capsule Take 290 mcg by mouth daily. 09/04/15  Yes Historical Provider, MD  losartan (COZAAR) 100 MG tablet Take 100 mg by mouth daily.   Yes Historical Provider, MD  nitroGLYCERIN (NITROSTAT) 0.4 MG SL tablet Place 1 tablet (0.4 mg total) under the tongue every 5 (five) minutes as needed for chest pain. 11/18/13  Yes Jettie Booze, MD  PREVIDENT 5000 BOOSTER PLUS 1.1 % PSTE Use as directed 1 application in the mouth or throat daily as needed (dental care).  10/24/13  Yes Historical Provider, MD  tamsulosin (FLOMAX) 0.4 MG CAPS capsule Take 0.4 mg by mouth daily.  09/20/13  Yes Historical Provider, MD   BP 168/86 mmHg  Pulse 58  Temp(Src) 97.4 F (36.3 C) (Oral)  Resp 18  Ht 6\' 1"  (1.854 m)  Wt 79.379 kg  BMI 23.09 kg/m2  SpO2 98% Physical Exam  Constitutional: He is oriented to person, place, and time. He appears well-developed and well-nourished. No distress.  HENT:  Head: Normocephalic and atraumatic.  Mouth/Throat: Oropharynx is clear and moist.  Eyes: Pupils are equal, round, and reactive to light.  Neck: Normal range of motion. Neck supple.  Cardiovascular: Normal rate, regular rhythm and normal heart sounds.  Exam reveals no gallop and no friction rub.   No murmur heard. Pulmonary/Chest: Effort normal and breath sounds normal. No respiratory distress. He has no wheezes.  Abdominal: Soft. Bowel sounds are normal. He exhibits no distension. There is no tenderness.  Neurological: He is alert and oriented  to person, place, and time. He exhibits normal muscle tone. Coordination normal.  Skin: Skin is warm and dry. No rash noted. No erythema.  Psychiatric: He has a normal mood and affect. His behavior is normal.  Nursing note and vitals reviewed.   ED Course  Procedures (including critical care time) Labs Review Labs Reviewed  COMPREHENSIVE METABOLIC PANEL - Abnormal; Notable for the following:    Glucose, Bld 115 (*)    Creatinine, Ser 1.31 (*)    Calcium 8.5 (*)    Total Protein 5.9 (*)    Albumin 3.2 (*)    GFR calc non Af Amer 45 (*)    GFR calc Af Amer 52 (*)    All other components within normal limits  CBC WITH DIFFERENTIAL/PLATELET - Abnormal; Notable for the following:    RBC 4.12 (*)    Neutro Abs 8.3 (*)    Lymphs Abs 0.5 (*)    All other components within normal limits  URINALYSIS, ROUTINE W REFLEX MICROSCOPIC (NOT AT [facility]) - Abnormal; Notable for the following:    Hgb urine dipstick TRACE (*)    Protein, ur 30 (*)    All other components within normal limits  TROPONIN I - Abnormal; Notable for the following:    Troponin I 0.04 (*)    All other components within normal limits  COMPREHENSIVE METABOLIC PANEL - Abnormal; Notable for the following:    Glucose, Bld 167 (*)    Creatinine, Ser 1.39 (*)    Calcium 8.4 (*)    Total Protein 5.4 (*)    Albumin 2.9 (*)    GFR calc non Af Amer 42 (*)    GFR calc Af Amer 48 (*)    All other components within normal limits  CBC WITH DIFFERENTIAL/PLATELET - Abnormal; Notable for the following:    RBC 3.89 (*)    Hemoglobin 12.7 (*)    MCV 100.5 (*)    All other components within normal limits  URINE MICROSCOPIC-ADD ON - Abnormal; Notable for the following:    Squamous Epithelial / LPF 0-5 (*)    Bacteria, UA RARE (*)    All other components within normal limits  GLUCOSE, CAPILLARY - Abnormal; Notable for the following:    Glucose-Capillary 135 (*)    All other components within normal limits  GLUCOSE, CAPILLARY -  Abnormal; Notable for the following:    Glucose-Capillary 130 (*)    All other components within normal limits  GLUCOSE, CAPILLARY - Abnormal; Notable for the following:    Glucose-Capillary 127 (*)    All other components within normal limits  GLUCOSE, CAPILLARY - Abnormal; Notable for the following:    Glucose-Capillary 138 (*)    All other components within normal limits  GLUCOSE, CAPILLARY - Abnormal; Notable for the following:    Glucose-Capillary 173 (*)    All other  components within normal limits  BASIC METABOLIC PANEL - Abnormal; Notable for the following:    Glucose, Bld 106 (*)    Creatinine, Ser 1.42 (*)    Calcium 8.4 (*)    GFR calc non Af Amer 41 (*)    GFR calc Af Amer 47 (*)    All other components within normal limits  CBC - Abnormal; Notable for the following:    RBC 3.94 (*)    MCV 100.8 (*)    All other components within normal limits  GLUCOSE, CAPILLARY - Abnormal; Notable for the following:    Glucose-Capillary 122 (*)    All other components within normal limits  GLUCOSE, CAPILLARY - Abnormal; Notable for the following:    Glucose-Capillary 113 (*)    All other components within normal limits  GLUCOSE, CAPILLARY - Abnormal; Notable for the following:    Glucose-Capillary 160 (*)    All other components within normal limits  GLUCOSE, CAPILLARY - Abnormal; Notable for the following:    Glucose-Capillary 171 (*)    All other components within normal limits  CBG MONITORING, ED - Abnormal; Notable for the following:    Glucose-Capillary 111 (*)    All other components within normal limits  TROPONIN I    Imaging Review No results found. I have personally reviewed and evaluated these images and lab results as part of my medical decision-making.   EKG Interpretation   Date/Time:  Monday October 09 2015 17:36:59 EST Ventricular Rate:  71 PR Interval:  174 QRS Duration: 81 QT Interval:  352 QTC Calculation: 382 R Axis:   52 Text Interpretation:   Sinus rhythm Nonspecific T abnormalities, lateral  leads No previous ECGs available Confirmed by ZACKOWSKI  MD, SCOTT [phone])  on 10/09/2015 5:59:45 PM      MDM   Final diagnoses:  Syncope, unspecified syncope type    Patient will be admitted to the hospital for further evaluation and care told plan and all questions were answered.  Patient be admitted for syncopal episode   Dalia Heading, PA-C 10/21/15 [id number]

## 2015-10-25 DIAGNOSIS — B9789 Other viral agents as the cause of diseases classified elsewhere: Secondary | ICD-10-CM | POA: Diagnosis not present

## 2015-10-25 DIAGNOSIS — N183 Chronic kidney disease, stage 3 (moderate): Secondary | ICD-10-CM | POA: Diagnosis not present

## 2015-10-25 DIAGNOSIS — I129 Hypertensive chronic kidney disease with stage 1 through stage 4 chronic kidney disease, or unspecified chronic kidney disease: Secondary | ICD-10-CM | POA: Diagnosis not present

## 2015-10-25 DIAGNOSIS — J069 Acute upper respiratory infection, unspecified: Secondary | ICD-10-CM | POA: Diagnosis not present

## 2015-11-01 DIAGNOSIS — E0865 Diabetes mellitus due to underlying condition with hyperglycemia: Secondary | ICD-10-CM | POA: Diagnosis not present

## 2015-11-01 DIAGNOSIS — R2689 Other abnormalities of gait and mobility: Secondary | ICD-10-CM | POA: Diagnosis not present

## 2015-11-01 DIAGNOSIS — R296 Repeated falls: Secondary | ICD-10-CM | POA: Diagnosis not present

## 2015-11-03 DIAGNOSIS — E0865 Diabetes mellitus due to underlying condition with hyperglycemia: Secondary | ICD-10-CM | POA: Diagnosis not present

## 2015-11-03 DIAGNOSIS — R2689 Other abnormalities of gait and mobility: Secondary | ICD-10-CM | POA: Diagnosis not present

## 2015-11-03 DIAGNOSIS — R296 Repeated falls: Secondary | ICD-10-CM | POA: Diagnosis not present

## 2015-11-06 DIAGNOSIS — R296 Repeated falls: Secondary | ICD-10-CM | POA: Diagnosis not present

## 2015-11-06 DIAGNOSIS — R2689 Other abnormalities of gait and mobility: Secondary | ICD-10-CM | POA: Diagnosis not present

## 2015-11-06 DIAGNOSIS — E0865 Diabetes mellitus due to underlying condition with hyperglycemia: Secondary | ICD-10-CM | POA: Diagnosis not present

## 2015-11-08 DIAGNOSIS — R296 Repeated falls: Secondary | ICD-10-CM | POA: Diagnosis not present

## 2015-11-08 DIAGNOSIS — E0865 Diabetes mellitus due to underlying condition with hyperglycemia: Secondary | ICD-10-CM | POA: Diagnosis not present

## 2015-11-08 DIAGNOSIS — R2689 Other abnormalities of gait and mobility: Secondary | ICD-10-CM | POA: Diagnosis not present

## 2015-11-13 DIAGNOSIS — R296 Repeated falls: Secondary | ICD-10-CM | POA: Diagnosis not present

## 2015-11-13 DIAGNOSIS — R2689 Other abnormalities of gait and mobility: Secondary | ICD-10-CM | POA: Diagnosis not present

## 2015-11-13 DIAGNOSIS — E0865 Diabetes mellitus due to underlying condition with hyperglycemia: Secondary | ICD-10-CM | POA: Diagnosis not present

## 2015-11-14 DIAGNOSIS — R2689 Other abnormalities of gait and mobility: Secondary | ICD-10-CM | POA: Diagnosis not present

## 2015-11-14 DIAGNOSIS — E0865 Diabetes mellitus due to underlying condition with hyperglycemia: Secondary | ICD-10-CM | POA: Diagnosis not present

## 2015-11-14 DIAGNOSIS — R296 Repeated falls: Secondary | ICD-10-CM | POA: Diagnosis not present

## 2015-11-15 DIAGNOSIS — R296 Repeated falls: Secondary | ICD-10-CM | POA: Diagnosis not present

## 2015-11-15 DIAGNOSIS — E0865 Diabetes mellitus due to underlying condition with hyperglycemia: Secondary | ICD-10-CM | POA: Diagnosis not present

## 2015-11-15 DIAGNOSIS — R2689 Other abnormalities of gait and mobility: Secondary | ICD-10-CM | POA: Diagnosis not present

## 2015-11-17 DIAGNOSIS — E0865 Diabetes mellitus due to underlying condition with hyperglycemia: Secondary | ICD-10-CM | POA: Diagnosis not present

## 2015-11-17 DIAGNOSIS — R2689 Other abnormalities of gait and mobility: Secondary | ICD-10-CM | POA: Diagnosis not present

## 2015-11-17 DIAGNOSIS — R296 Repeated falls: Secondary | ICD-10-CM | POA: Diagnosis not present

## 2015-11-20 DIAGNOSIS — R296 Repeated falls: Secondary | ICD-10-CM | POA: Diagnosis not present

## 2015-11-20 DIAGNOSIS — R2689 Other abnormalities of gait and mobility: Secondary | ICD-10-CM | POA: Diagnosis not present

## 2015-11-20 DIAGNOSIS — E0865 Diabetes mellitus due to underlying condition with hyperglycemia: Secondary | ICD-10-CM | POA: Diagnosis not present

## 2015-11-22 DIAGNOSIS — R2689 Other abnormalities of gait and mobility: Secondary | ICD-10-CM | POA: Diagnosis not present

## 2015-11-22 DIAGNOSIS — E0865 Diabetes mellitus due to underlying condition with hyperglycemia: Secondary | ICD-10-CM | POA: Diagnosis not present

## 2015-11-22 DIAGNOSIS — R296 Repeated falls: Secondary | ICD-10-CM | POA: Diagnosis not present

## 2015-11-24 DIAGNOSIS — R2689 Other abnormalities of gait and mobility: Secondary | ICD-10-CM | POA: Diagnosis not present

## 2015-11-24 DIAGNOSIS — R296 Repeated falls: Secondary | ICD-10-CM | POA: Diagnosis not present

## 2015-11-24 DIAGNOSIS — E0865 Diabetes mellitus due to underlying condition with hyperglycemia: Secondary | ICD-10-CM | POA: Diagnosis not present

## 2015-11-27 DIAGNOSIS — R296 Repeated falls: Secondary | ICD-10-CM | POA: Diagnosis not present

## 2015-11-27 DIAGNOSIS — E0865 Diabetes mellitus due to underlying condition with hyperglycemia: Secondary | ICD-10-CM | POA: Diagnosis not present

## 2015-11-27 DIAGNOSIS — R2689 Other abnormalities of gait and mobility: Secondary | ICD-10-CM | POA: Diagnosis not present

## 2015-11-30 ENCOUNTER — Encounter: Payer: Self-pay | Admitting: Gastroenterology

## 2015-11-30 ENCOUNTER — Ambulatory Visit (INDEPENDENT_AMBULATORY_CARE_PROVIDER_SITE_OTHER): Payer: Medicare Other | Admitting: Gastroenterology

## 2015-11-30 VITALS — BP 134/70 | HR 72 | Ht 71.5 in | Wt 174.5 lb

## 2015-11-30 DIAGNOSIS — K5901 Slow transit constipation: Secondary | ICD-10-CM | POA: Diagnosis not present

## 2015-11-30 NOTE — Progress Notes (Signed)
[facility] Consult Note:  History: Andrew Weaver [id number]  Referring physician: Mathews Argyle, MD  Reason for consult/chief complaint: Constipation and Diarrhea   Subjective HPI:  This is the initial office visit for a 82 year old man coming by his daughter. He is complaining of many years of constipation that has gradually worsened. He reports no relief from a trial of MiraLAX, and Linzess seem to work for about the first 2 months. Even when it did, it would give him an unpredictable diarrhea. He has lately taken to using milk of magnesia once a week which predictably relieves his constipation, but then he has no BM for about another week. There's been no rectal bleeding, he denies abdominal pain, bloating, nausea, vomiting, or early satiety. He has lost some weight since moving to an assisted living and admits that his appetite has decreased. His daughter says that he has been reluctant to add fiber to his diet, she feels that he is "a [employer] officer, [employer]", and obsesses about everything including his bowel habits.    ROS:  Review of Systems He denies chest pain dyspnea or dysuria  Past Medical History: Past Medical History  Diagnosis Date  . Diabetes mellitus   . Hypertension   . Hyperlipidemia     goal LDL less than 70  . CAD (coronary artery disease)   . PVD (peripheral vascular disease) ([place])   . BPH (benign prostatic hypertrophy)   . Chronic kidney disease     borderline stage II/stage III  . Lock jaw      Past Surgical History: Past Surgical History  Procedure Laterality Date  . Cataract extraction Bilateral   . Coronary angioplasty      with stent  . Patella fracture surgery Left      Family History: Family History  Problem Relation Age of Onset  . Heart disease Mother   . Heart disease Father   . Diabetes Father     Social History: Social History   Social History  . Marital Status: Married    Spouse Name: N/A  . Number of Children: 1   . Years of Education: N/A   Occupational History  . retired    Social History Main Topics  . Smoking status: Former Smoker    Types: Cigarettes    Quit date: 09/17/1991  . Smokeless tobacco: Never Used  . Alcohol Use: 0.0 oz/week    0 Standard drinks or equivalent per week     Comment: social  . Drug Use: No  . Sexual Activity: Not Asked   Other Topics Concern  . None   Social History Narrative    Allergies: Allergies  Allergen Reactions  . Lipitor [Atorvastatin]     fatigue    Outpatient Meds: Current Outpatient Prescriptions  Medication Sig Dispense Refill  . aspirin 81 MG tablet Take 1 tablet (81 mg total) by mouth daily. 30 tablet   . atorvastatin (LIPITOR) 10 MG tablet Take 10 mg by mouth daily.    . clopidogrel (PLAVIX) 75 MG tablet Take 75 mg by mouth once.     . insulin glargine (LANTUS) 100 UNIT/ML injection Inject 20 Units into the skin daily.     . insulin regular (NOVOLIN R,HUMULIN R) 100 units/mL injection Inject 5-10 Units into the skin 3 (three) times daily before meals. 5 units if blood sugar under 150, 10 units if over 200 - estimates in between 150 and 200    . losartan (COZAAR) 100 MG tablet Take 100 mg by mouth daily.    Marland Kitchen  PREVIDENT 5000 BOOSTER PLUS 1.1 % PSTE Use as directed 1 application in the mouth or throat daily as needed (dental care).     . tamsulosin (FLOMAX) 0.4 MG CAPS capsule Take 0.4 mg by mouth daily.     Marland Kitchen LINZESS 290 MCG CAPS capsule Take 290 mcg by mouth daily. Reported on 11/30/2015  6  . nitroGLYCERIN (NITROSTAT) 0.4 MG SL tablet Place 1 tablet (0.4 mg total) under the tongue every 5 (five) minutes as needed for chest pain. (Patient not taking: Reported on 11/30/2015) 25 tablet 5   No current facility-administered medications for this visit.      ___________________________________________________________________ Objective  Exam:  BP 134/70 mmHg  Pulse 72  Ht 5' 11.5" (1.816 m)  Wt 174 lb 8 oz (79.153 kg)  BMI 24.00  kg/m2   General: this is a feeble elderly man, fair muscle mass   Eyes: sclera anicteric, no redness  ENT: oral mucosa moist without lesions, no cervical or supraclavicular lymphadenopathy, good dentition  CV: RRR without murmur, S1/S2, no JVD, no peripheral edema  Resp: clear to auscultation bilaterally, normal RR and effort noted  GI: soft, no tenderness, with active bowel sounds. No guarding or palpable organomegaly noted.  Skin; warm and dry, no rash or jaundice noted  Neuro: awake, alert and oriented x 3. Normal gross motor function and fluent speech Rectal exam was deferred  Assessment: Encounter Diagnosis  Name Primary?  . Slow transit constipation Yes    It does not sound like an obstruction, and I think the risks of a colonoscopy outweigh the expected benefits at his age. He was really hoping we had some alternate medicine that would restore his bowel function to the point that he would have a normal spontaneous BM every 1 or 2 days. I do not think that is feasible, since he appears to have age-related decline in motility, and there is no medicine to restore that. It sounds like he has also been somewhat resistant to efforts at increased dietary fiber.   Plan:  Conservative management. I reassured him that milk of magnesia is okay to use 2 or, if needed, 3 times a week. It is safe, and it sounds like it predictably relieves his constipation. He will see me as needed.  Thank you for the courtesy of this consult.  Please call me with any questions or concerns.  Nelida Meuse III

## 2015-12-01 DIAGNOSIS — R2689 Other abnormalities of gait and mobility: Secondary | ICD-10-CM | POA: Diagnosis not present

## 2015-12-01 DIAGNOSIS — E0865 Diabetes mellitus due to underlying condition with hyperglycemia: Secondary | ICD-10-CM | POA: Diagnosis not present

## 2015-12-01 DIAGNOSIS — R296 Repeated falls: Secondary | ICD-10-CM | POA: Diagnosis not present

## 2015-12-04 DIAGNOSIS — R2689 Other abnormalities of gait and mobility: Secondary | ICD-10-CM | POA: Diagnosis not present

## 2015-12-04 DIAGNOSIS — E0865 Diabetes mellitus due to underlying condition with hyperglycemia: Secondary | ICD-10-CM | POA: Diagnosis not present

## 2015-12-04 DIAGNOSIS — R296 Repeated falls: Secondary | ICD-10-CM | POA: Diagnosis not present

## 2015-12-05 DIAGNOSIS — N183 Chronic kidney disease, stage 3 (moderate): Secondary | ICD-10-CM | POA: Diagnosis not present

## 2015-12-05 DIAGNOSIS — K5900 Constipation, unspecified: Secondary | ICD-10-CM | POA: Diagnosis not present

## 2015-12-05 DIAGNOSIS — E1121 Type 2 diabetes mellitus with diabetic nephropathy: Secondary | ICD-10-CM | POA: Diagnosis not present

## 2015-12-05 DIAGNOSIS — R269 Unspecified abnormalities of gait and mobility: Secondary | ICD-10-CM | POA: Diagnosis not present

## 2015-12-05 DIAGNOSIS — I129 Hypertensive chronic kidney disease with stage 1 through stage 4 chronic kidney disease, or unspecified chronic kidney disease: Secondary | ICD-10-CM | POA: Diagnosis not present

## 2015-12-05 DIAGNOSIS — S0101XA Laceration without foreign body of scalp, initial encounter: Secondary | ICD-10-CM | POA: Diagnosis not present

## 2015-12-08 DIAGNOSIS — E0865 Diabetes mellitus due to underlying condition with hyperglycemia: Secondary | ICD-10-CM | POA: Diagnosis not present

## 2015-12-08 DIAGNOSIS — R2689 Other abnormalities of gait and mobility: Secondary | ICD-10-CM | POA: Diagnosis not present

## 2015-12-08 DIAGNOSIS — R41841 Cognitive communication deficit: Secondary | ICD-10-CM | POA: Diagnosis not present

## 2015-12-08 DIAGNOSIS — Z9181 History of falling: Secondary | ICD-10-CM | POA: Diagnosis not present

## 2015-12-13 DIAGNOSIS — E0865 Diabetes mellitus due to underlying condition with hyperglycemia: Secondary | ICD-10-CM | POA: Diagnosis not present

## 2015-12-13 DIAGNOSIS — R41841 Cognitive communication deficit: Secondary | ICD-10-CM | POA: Diagnosis not present

## 2015-12-13 DIAGNOSIS — R2689 Other abnormalities of gait and mobility: Secondary | ICD-10-CM | POA: Diagnosis not present

## 2015-12-13 DIAGNOSIS — Z9181 History of falling: Secondary | ICD-10-CM | POA: Diagnosis not present

## 2015-12-15 DIAGNOSIS — Z9181 History of falling: Secondary | ICD-10-CM | POA: Diagnosis not present

## 2015-12-15 DIAGNOSIS — R41841 Cognitive communication deficit: Secondary | ICD-10-CM | POA: Diagnosis not present

## 2015-12-15 DIAGNOSIS — R2689 Other abnormalities of gait and mobility: Secondary | ICD-10-CM | POA: Diagnosis not present

## 2015-12-15 DIAGNOSIS — E0865 Diabetes mellitus due to underlying condition with hyperglycemia: Secondary | ICD-10-CM | POA: Diagnosis not present

## 2015-12-21 DIAGNOSIS — Z9181 History of falling: Secondary | ICD-10-CM | POA: Diagnosis not present

## 2015-12-21 DIAGNOSIS — R2689 Other abnormalities of gait and mobility: Secondary | ICD-10-CM | POA: Diagnosis not present

## 2015-12-21 DIAGNOSIS — R41841 Cognitive communication deficit: Secondary | ICD-10-CM | POA: Diagnosis not present

## 2015-12-21 DIAGNOSIS — E0865 Diabetes mellitus due to underlying condition with hyperglycemia: Secondary | ICD-10-CM | POA: Diagnosis not present

## 2015-12-22 DIAGNOSIS — K5901 Slow transit constipation: Secondary | ICD-10-CM | POA: Diagnosis not present

## 2015-12-22 DIAGNOSIS — L03116 Cellulitis of left lower limb: Secondary | ICD-10-CM | POA: Diagnosis not present

## 2016-02-21 DIAGNOSIS — N183 Chronic kidney disease, stage 3 (moderate): Secondary | ICD-10-CM | POA: Diagnosis not present

## 2016-02-21 DIAGNOSIS — I129 Hypertensive chronic kidney disease with stage 1 through stage 4 chronic kidney disease, or unspecified chronic kidney disease: Secondary | ICD-10-CM | POA: Diagnosis not present

## 2016-02-21 DIAGNOSIS — K5901 Slow transit constipation: Secondary | ICD-10-CM | POA: Diagnosis not present

## 2016-02-21 DIAGNOSIS — R05 Cough: Secondary | ICD-10-CM | POA: Diagnosis not present

## 2016-02-21 DIAGNOSIS — Z794 Long term (current) use of insulin: Secondary | ICD-10-CM | POA: Diagnosis not present

## 2016-02-21 DIAGNOSIS — E1142 Type 2 diabetes mellitus with diabetic polyneuropathy: Secondary | ICD-10-CM | POA: Diagnosis not present

## 2016-02-21 DIAGNOSIS — D692 Other nonthrombocytopenic purpura: Secondary | ICD-10-CM | POA: Diagnosis not present

## 2016-02-21 DIAGNOSIS — E1121 Type 2 diabetes mellitus with diabetic nephropathy: Secondary | ICD-10-CM | POA: Diagnosis not present

## 2016-03-20 ENCOUNTER — Encounter: Payer: Self-pay | Admitting: Physician Assistant

## 2016-03-20 ENCOUNTER — Ambulatory Visit (INDEPENDENT_AMBULATORY_CARE_PROVIDER_SITE_OTHER): Payer: Medicare Other | Admitting: Physician Assistant

## 2016-03-20 ENCOUNTER — Encounter (INDEPENDENT_AMBULATORY_CARE_PROVIDER_SITE_OTHER): Payer: Self-pay

## 2016-03-20 VITALS — BP 130/70 | HR 64 | Ht 71.5 in | Wt 172.0 lb

## 2016-03-20 DIAGNOSIS — I2510 Atherosclerotic heart disease of native coronary artery without angina pectoris: Secondary | ICD-10-CM | POA: Diagnosis not present

## 2016-03-20 DIAGNOSIS — E782 Mixed hyperlipidemia: Secondary | ICD-10-CM | POA: Diagnosis not present

## 2016-03-20 DIAGNOSIS — Z794 Long term (current) use of insulin: Secondary | ICD-10-CM

## 2016-03-20 DIAGNOSIS — E1122 Type 2 diabetes mellitus with diabetic chronic kidney disease: Secondary | ICD-10-CM | POA: Diagnosis not present

## 2016-03-20 DIAGNOSIS — N183 Chronic kidney disease, stage 3 unspecified: Secondary | ICD-10-CM

## 2016-03-20 DIAGNOSIS — I10 Essential (primary) hypertension: Secondary | ICD-10-CM

## 2016-03-20 NOTE — Progress Notes (Signed)
Cardiology Office Note    Date:  03/20/2016   ID:  Andrew Weaver, DOB [id number], MRN [id number]  PCP:  Mathews Argyle, MD  Cardiologist:  Dr. Irish Lack  Chief Complaint  Patient presents with  . Follow-up    seen for Dr. Irish Lack    History of Present Illness:  Andrew Weaver is a 82 y.o. male who presents for cardiology office visit. He has PMH of CAD s/p 2 stents in LAD in [place], HTN, HLD, DM, CKD stage III and PVD. In 2013, he had a syncopal episode in the cuts over his left arm requiring stitches. He also had an episode of slurred speech and was started on aspirin. His losartan was decreased in half due to dizziness. His last office visit was in March 2016. He had bruises with aspirin and Plavix combination.  He was previously admitted in January 2017 with fall. Apparently he was leaving to get dinner when his leg became weak and he fell forward hitting his head against the door. He had a large periorbital hematoma. Carotid ultrasound showed only mild 1-39% ICA stenosis bilaterally, antegrade vertebral flow. Echocardiogram obtained on 10/15/2015 which showed normal LV cavity size and thickness, mild AR.  He presents today for cardiology visit, he denies any recent chest discomfort or shortness of breath. He continued to ambulate on a daily basis and drive as well. He says the last time he was admitted, his blood sugar was low. He has been followed by Dr. Felipa Eth for his diabetes. His last hemoglobin A1c in our system was in January 2016. He also says his insurance recently changed his Lantus to another long-acting insulin. He has been watching out for any additional episodes of presyncope or syncope by keeping candies in his pocket. He says he has 2 syncope so far in his life and both occurred around 4 PM when his blood sugar is low.     Past Medical History  Diagnosis Date  . Diabetes mellitus   . Hypertension   . Hyperlipidemia     goal LDL less than 70  . CAD  (coronary artery disease)   . PVD (peripheral vascular disease) ([place])   . BPH (benign prostatic hypertrophy)   . Chronic kidney disease     borderline stage II/stage III  . Lock jaw     Past Surgical History  Procedure Laterality Date  . Cataract extraction Bilateral   . Coronary angioplasty      with stent  . Patella fracture surgery Left     Current Medications: Outpatient Prescriptions Prior to Visit  Medication Sig Dispense Refill  . atorvastatin (LIPITOR) 10 MG tablet Take 10 mg by mouth daily.    . clopidogrel (PLAVIX) 75 MG tablet Take 75 mg by mouth once.     . insulin regular (NOVOLIN R,HUMULIN R) 100 units/mL injection Inject 5-10 Units into the skin 3 (three) times daily before meals. 5 units if blood sugar under 150, 10 units if over 200 - estimates in between 150 and 200    . losartan (COZAAR) 100 MG tablet Take 100 mg by mouth daily.    Marland Kitchen PREVIDENT 5000 BOOSTER PLUS 1.1 % PSTE Use as directed 1 application in the mouth or throat daily as needed (dental care).     Marland Kitchen aspirin 81 MG tablet Take 1 tablet (81 mg total) by mouth daily. (Patient not taking: Reported on 03/20/2016) 30 tablet   . insulin glargine (LANTUS) 100 UNIT/ML injection Inject 20 Units into the  skin daily. Reported on 03/20/2016    . LINZESS 290 MCG CAPS capsule Take 290 mcg by mouth daily. Reported on 03/20/2016  6  . nitroGLYCERIN (NITROSTAT) 0.4 MG SL tablet Place 1 tablet (0.4 mg total) under the tongue every 5 (five) minutes as needed for chest pain. (Patient not taking: Reported on 11/30/2015) 25 tablet 5  . tamsulosin (FLOMAX) 0.4 MG CAPS capsule Take 0.4 mg by mouth daily. Reported on 03/20/2016     No facility-administered medications prior to visit.     Allergies:   Lipitor   Social History   Social History  . Marital Status: Married    Spouse Name: N/A  . Number of Children: 1  . Years of Education: N/A   Occupational History  . retired    Social History Main Topics  . Smoking status:  Former Smoker    Types: Cigarettes    Quit date: 09/17/1991  . Smokeless tobacco: Never Used  . Alcohol Use: 0.0 oz/week    0 Standard drinks or equivalent per week     Comment: social  . Drug Use: No  . Sexual Activity: Not Asked   Other Topics Concern  . None   Social History Narrative     Family History:  The patient's family history includes Diabetes in his father; Heart attack in his father and mother; Heart disease in his father and mother.   ROS:   Please see the history of present illness.    ROS All other systems reviewed and are negative.   PHYSICAL EXAM:   VS:  BP 130/70 mmHg  Pulse 64  Ht 5' 11.5" (1.816 m)  Wt 172 lb (78.019 kg)  BMI 23.66 kg/m2   GEN: Well nourished, well developed, in no acute distress HEENT: normal Neck: no JVD, carotid bruits, or masses Cardiac: RRR; no murmurs, rubs, or gallops,no edema  Respiratory:  clear to auscultation bilaterally, normal work of breathing GI: soft, nontender, nondistended, + BS MS: no deformity or atrophy Skin: warm and dry, no rash Neuro:  Alert and Oriented x 3, Strength and sensation are intact Psych: euthymic mood, full affect  Wt Readings from Last 3 Encounters:  03/20/16 172 lb (78.019 kg)  11/30/15 174 lb 8 oz (79.153 kg)  10/11/15 175 lb (79.379 kg)      Studies/Labs Reviewed:   EKG:  EKG is ordered today.  The ekg ordered today demonstrates NSR without significant ST-T wave changes  Recent Labs: 10/10/2015: ALT 18 10/11/2015: BUN 15; Creatinine, Ser 1.42*; Hemoglobin 13.1; Platelets 183; Potassium 4.2; Sodium 138   Lipid Panel No results found for: CHOL, TRIG, HDL, CHOLHDL, VLDL, LDLCALC, LDLDIRECT  Additional studies/ records that were reviewed today include:   Echo 10/12/2015  - Left ventricle: The cavity size was normal. Wall thickness was  normal. - Aortic valve: There was mild regurgitation.   ASSESSMENT:    1. Atherosclerosis of native coronary artery of native heart without  angina pectoris   2. Essential hypertension   3. Mixed hyperlipidemia   4. Type 2 diabetes mellitus with stage 3 chronic kidney disease, with long-term current use of insulin (HCC)      PLAN:  In order of problems listed above:  1. CAD s/p PCIx2 in [place] 10 years ago after abnormal ETT: no angina. Last echo in 09/2015 reviewed. Stable. Will followup in 6-12 month depend on if any progressive symptom, if no symptom, I am fine with him followup in 1 year  2. HTN: stable  on losartan  3. HLD: on lipitor, need fasting lipid and LFT on a yearly basis. I do not see previous lipid panel in EPIC, ?obtain at PCP's office?Marland Kitchen LFT normal in Jan 2017  4. IDDM: Managed by PCP, need Hgb A1C    Medication Adjustments/Labs and Tests Ordered: Current medicines are reviewed at length with the patient today.  Concerns regarding medicines are outlined above.  Medication changes, Labs and Tests ordered today are listed in the Patient Instructions below. Patient Instructions  Medication Instructions:  None  Labwork: None  Testing/Procedures: None  Follow-Up: Your physician wants you to follow-up in: 6-12 months with Dr. Irish Lack. You will receive a reminder letter in the mail two months in advance. If you don't receive a letter, please call our office to schedule the follow-up appointment.   Any Other Special Instructions Will Be Listed Below (If Applicable).     If you need a refill on your cardiac medications before your next appointment, please call your pharmacy.       Hilbert Corrigan, [place]  03/20/2016 12:05 PM    [address], [place], [place]  [address] Phone: [phone]; Fax: [phone]

## 2016-03-20 NOTE — Patient Instructions (Signed)
Medication Instructions:  None  Labwork: None  Testing/Procedures: None  Follow-Up: Your physician wants you to follow-up in: 6-12 months with Dr. Irish Lack. You will receive a reminder letter in the mail two months in advance. If you don't receive a letter, please call our office to schedule the follow-up appointment.   Any Other Special Instructions Will Be Listed Below (If Applicable).     If you need a refill on your cardiac medications before your next appointment, please call your pharmacy.

## 2016-04-27 ENCOUNTER — Emergency Department (HOSPITAL_COMMUNITY): Payer: Medicare Other

## 2016-04-27 ENCOUNTER — Ambulatory Visit (INDEPENDENT_AMBULATORY_CARE_PROVIDER_SITE_OTHER): Payer: Medicare Other | Admitting: Family Medicine

## 2016-04-27 ENCOUNTER — Inpatient Hospital Stay (HOSPITAL_COMMUNITY)
Admission: EM | Admit: 2016-04-27 | Discharge: 2016-04-29 | DRG: 093 | Disposition: A | Discharge status: Home Health | Payer: Medicare Other | Attending: Internal Medicine | Admitting: Internal Medicine

## 2016-04-27 ENCOUNTER — Encounter (HOSPITAL_COMMUNITY): Payer: Self-pay | Admitting: *Deleted

## 2016-04-27 ENCOUNTER — Encounter: Payer: Self-pay | Admitting: Family Medicine

## 2016-04-27 VITALS — BP 171/67 | HR 73 | Temp 98.4°F | Resp 16

## 2016-04-27 DIAGNOSIS — S51811A Laceration without foreign body of right forearm, initial encounter: Secondary | ICD-10-CM | POA: Diagnosis not present

## 2016-04-27 DIAGNOSIS — R4789 Other speech disturbances: Secondary | ICD-10-CM | POA: Diagnosis not present

## 2016-04-27 DIAGNOSIS — Z794 Long term (current) use of insulin: Secondary | ICD-10-CM | POA: Diagnosis not present

## 2016-04-27 DIAGNOSIS — J984 Other disorders of lung: Secondary | ICD-10-CM | POA: Diagnosis not present

## 2016-04-27 DIAGNOSIS — E1142 Type 2 diabetes mellitus with diabetic polyneuropathy: Secondary | ICD-10-CM | POA: Diagnosis present

## 2016-04-27 DIAGNOSIS — E1122 Type 2 diabetes mellitus with diabetic chronic kidney disease: Secondary | ICD-10-CM | POA: Diagnosis present

## 2016-04-27 DIAGNOSIS — R030 Elevated blood-pressure reading, without diagnosis of hypertension: Secondary | ICD-10-CM | POA: Diagnosis not present

## 2016-04-27 DIAGNOSIS — I129 Hypertensive chronic kidney disease with stage 1 through stage 4 chronic kidney disease, or unspecified chronic kidney disease: Secondary | ICD-10-CM | POA: Diagnosis present

## 2016-04-27 DIAGNOSIS — R4701 Aphasia: Secondary | ICD-10-CM | POA: Diagnosis not present

## 2016-04-27 DIAGNOSIS — W19XXXA Unspecified fall, initial encounter: Secondary | ICD-10-CM | POA: Diagnosis present

## 2016-04-27 DIAGNOSIS — G3184 Mild cognitive impairment, so stated: Secondary | ICD-10-CM | POA: Diagnosis not present

## 2016-04-27 DIAGNOSIS — Z79899 Other long term (current) drug therapy: Secondary | ICD-10-CM

## 2016-04-27 DIAGNOSIS — I10 Essential (primary) hypertension: Secondary | ICD-10-CM

## 2016-04-27 DIAGNOSIS — I2510 Atherosclerotic heart disease of native coronary artery without angina pectoris: Secondary | ICD-10-CM

## 2016-04-27 DIAGNOSIS — E1151 Type 2 diabetes mellitus with diabetic peripheral angiopathy without gangrene: Secondary | ICD-10-CM | POA: Diagnosis not present

## 2016-04-27 DIAGNOSIS — Z87891 Personal history of nicotine dependence: Secondary | ICD-10-CM

## 2016-04-27 DIAGNOSIS — Z66 Do not resuscitate: Secondary | ICD-10-CM | POA: Diagnosis not present

## 2016-04-27 DIAGNOSIS — R29818 Other symptoms and signs involving the nervous system: Secondary | ICD-10-CM | POA: Diagnosis not present

## 2016-04-27 DIAGNOSIS — I4891 Unspecified atrial fibrillation: Secondary | ICD-10-CM | POA: Diagnosis not present

## 2016-04-27 DIAGNOSIS — Z955 Presence of coronary angioplasty implant and graft: Secondary | ICD-10-CM | POA: Diagnosis not present

## 2016-04-27 DIAGNOSIS — W19XXXD Unspecified fall, subsequent encounter: Secondary | ICD-10-CM | POA: Diagnosis not present

## 2016-04-27 DIAGNOSIS — N183 Chronic kidney disease, stage 3 (moderate): Secondary | ICD-10-CM | POA: Diagnosis present

## 2016-04-27 DIAGNOSIS — E782 Mixed hyperlipidemia: Secondary | ICD-10-CM

## 2016-04-27 DIAGNOSIS — Z7902 Long term (current) use of antithrombotics/antiplatelets: Secondary | ICD-10-CM | POA: Diagnosis not present

## 2016-04-27 LAB — COMPREHENSIVE METABOLIC PANEL
ALK PHOS: 89 U/L (ref 38–126)
ALT: 20 U/L (ref 17–63)
AST: 35 U/L (ref 15–41)
Albumin: 3.6 g/dL (ref 3.5–5.0)
Anion gap: 7 (ref 5–15)
BILIRUBIN TOTAL: 0.7 mg/dL (ref 0.3–1.2)
BUN: 16 mg/dL (ref 6–20)
CALCIUM: 8.9 mg/dL (ref 8.9–10.3)
CHLORIDE: 103 mmol/L (ref 101–111)
CO2: 28 mmol/L (ref 22–32)
CREATININE: 1.35 mg/dL — AB (ref 0.61–1.24)
GFR, EST AFRICAN AMERICAN: 50 mL/min — AB (ref >60)
GFR, EST NON AFRICAN AMERICAN: 43 mL/min — AB (ref >60)
Glucose, Bld: 62 mg/dL — ABNORMAL LOW (ref 65–99)
Potassium: 4.1 mmol/L (ref 3.5–5.1)
Sodium: 138 mmol/L (ref 135–145)
TOTAL PROTEIN: 6.7 g/dL (ref 6.5–8.1)

## 2016-04-27 LAB — CBG MONITORING, ED
GLUCOSE-CAPILLARY: 76 mg/dL (ref 65–99)
Glucose-Capillary: 104 mg/dL — ABNORMAL HIGH (ref 65–99)

## 2016-04-27 LAB — CBC
HCT: 42.3 % (ref 39.0–52.0)
HEMATOCRIT: 43.7 % (ref 39.0–52.0)
HEMOGLOBIN: 14 g/dL (ref 13.0–17.0)
Hemoglobin: 14.6 g/dL (ref 13.0–17.0)
MCH: 34 pg (ref 26.0–34.0)
MCH: 33.5 pg (ref 26.0–34.0)
MCHC: 33.4 g/dL (ref 30.0–36.0)
MCHC: 33.1 g/dL (ref 30.0–36.0)
MCV: 101.9 fL — AB (ref 78.0–100.0)
MCV: 101.2 fL — ABNORMAL HIGH (ref 78.0–100.0)
PLATELETS: 193 10*3/uL (ref 150–400)
Platelets: 211 10*3/uL (ref 150–400)
RBC: 4.29 MIL/uL (ref 4.22–5.81)
RBC: 4.18 MIL/uL — ABNORMAL LOW (ref 4.22–5.81)
RDW: 13.7 % (ref 11.5–15.5)
RDW: 13.5 % (ref 11.5–15.5)
WBC: 8.2 10*3/uL (ref 4.0–10.5)
WBC: 9.2 10*3/uL (ref 4.0–10.5)

## 2016-04-27 LAB — PROTIME-INR
INR: 1.03
PROTHROMBIN TIME: 13.5 s (ref 11.4–15.2)

## 2016-04-27 LAB — DIFFERENTIAL
BASOS PCT: 0 %
Basophils Absolute: 0 10*3/uL (ref 0.0–0.1)
Eosinophils Absolute: 0 10*3/uL (ref 0.0–0.7)
Eosinophils Relative: 0 %
LYMPHS ABS: 1 10*3/uL (ref 0.7–4.0)
Lymphocytes Relative: 12 %
MONO ABS: 0.5 10*3/uL (ref 0.1–1.0)
MONOS PCT: 6 %
NEUTROS ABS: 6.7 10*3/uL (ref 1.7–7.7)
Neutrophils Relative %: 82 %

## 2016-04-27 LAB — URINALYSIS, ROUTINE W REFLEX MICROSCOPIC
Bilirubin Urine: NEGATIVE
Glucose, UA: 100 mg/dL — AB
Hgb urine dipstick: NEGATIVE
Ketones, ur: NEGATIVE mg/dL
LEUKOCYTES UA: NEGATIVE
NITRITE: NEGATIVE
PROTEIN: 100 mg/dL — AB
SPECIFIC GRAVITY, URINE: 1.02 (ref 1.005–1.030)
pH: 7 (ref 5.0–8.0)

## 2016-04-27 LAB — I-STAT CHEM 8, ED
BUN: 18 mg/dL (ref 6–20)
CALCIUM ION: 1.15 mmol/L (ref 1.12–1.23)
CHLORIDE: 104 mmol/L (ref 101–111)
Creatinine, Ser: 1.3 mg/dL — ABNORMAL HIGH (ref 0.61–1.24)
Glucose, Bld: 58 mg/dL — ABNORMAL LOW (ref 65–99)
HCT: 44 % (ref 39.0–52.0)
Hemoglobin: 15 g/dL (ref 13.0–17.0)
POTASSIUM: 4.1 mmol/L (ref 3.5–5.1)
SODIUM: 143 mmol/L (ref 135–145)
TCO2: 28 mmol/L (ref 0–100)

## 2016-04-27 LAB — LIPID PANEL
CHOLESTEROL: 143 mg/dL (ref 0–200)
HDL: 51 mg/dL (ref >40)
LDL CALC: 75 mg/dL (ref 0–99)
TRIGLYCERIDES: 86 mg/dL (ref <150)
Total CHOL/HDL Ratio: 2.8 RATIO
VLDL: 17 mg/dL (ref 0–40)

## 2016-04-27 LAB — RAPID URINE DRUG SCREEN, HOSP PERFORMED
Amphetamines: NOT DETECTED
Barbiturates: NOT DETECTED
Benzodiazepines: NOT DETECTED
Cocaine: NOT DETECTED
Opiates: NOT DETECTED
Tetrahydrocannabinol: NOT DETECTED

## 2016-04-27 LAB — GLUCOSE, CAPILLARY: GLUCOSE-CAPILLARY: 309 mg/dL — AB (ref 65–99)

## 2016-04-27 LAB — TSH: TSH: 3.048 u[IU]/mL (ref 0.350–4.500)

## 2016-04-27 LAB — I-STAT TROPONIN, ED: TROPONIN I, POC: 0.01 ng/mL (ref 0.00–0.08)

## 2016-04-27 LAB — URINE MICROSCOPIC-ADD ON

## 2016-04-27 LAB — CREATININE, SERUM
CREATININE: 1.2 mg/dL (ref 0.61–1.24)
GFR calc Af Amer: 58 mL/min — ABNORMAL LOW (ref >60)
GFR, EST NON AFRICAN AMERICAN: 50 mL/min — AB (ref >60)

## 2016-04-27 LAB — APTT: aPTT: 30 seconds (ref 24–36)

## 2016-04-27 LAB — GLUCOSE, POCT (MANUAL RESULT ENTRY): POC Glucose: 113 mg/dl — AB (ref 70–99)

## 2016-04-27 LAB — ETHANOL

## 2016-04-27 MED ORDER — ONDANSETRON HCL 4 MG PO TABS: 4.0000 mg | ORAL_TABLET | Freq: Four times a day (QID) | ORAL | Status: DC | PRN

## 2016-04-27 MED ORDER — ACETAMINOPHEN 325 MG PO TABS: 650.0000 mg | ORAL_TABLET | Freq: Four times a day (QID) | ORAL | Status: DC | PRN

## 2016-04-27 MED ORDER — POLYETHYLENE GLYCOL 3350 17 G PO PACK: 17.0000 g | PACK | Freq: Every day | ORAL | Status: DC | PRN

## 2016-04-27 MED ORDER — CLOPIDOGREL BISULFATE 75 MG PO TABS
75.0000 mg | ORAL_TABLET | Freq: Once | ORAL | Status: AC
  Administered 2016-04-27: 75 mg via ORAL
  Filled 2016-04-27: qty 1

## 2016-04-27 MED ORDER — ACETAMINOPHEN 650 MG RE SUPP: 650.0000 mg | Freq: Four times a day (QID) | RECTAL | Status: DC | PRN

## 2016-04-27 MED ORDER — SODIUM CHLORIDE 0.9% FLUSH
3.0000 mL | Freq: Two times a day (BID) | INTRAVENOUS | Status: DC
  Administered 2016-04-27 – 2016-04-28 (×2): 3 mL via INTRAVENOUS

## 2016-04-27 MED ORDER — LOSARTAN POTASSIUM 50 MG PO TABS: 100.0000 mg | ORAL_TABLET | Freq: Every day | ORAL | Status: DC

## 2016-04-27 MED ORDER — ONDANSETRON HCL 4 MG/2ML IJ SOLN: 4.0000 mg | Freq: Four times a day (QID) | INTRAMUSCULAR | Status: DC | PRN

## 2016-04-27 MED ORDER — TETANUS-DIPHTH-ACELL PERTUSSIS 5-2.5-18.5 LF-MCG/0.5 IM SUSP
0.5000 mL | Freq: Once | INTRAMUSCULAR | Status: AC
  Administered 2016-04-27: 0.5 mL via INTRAMUSCULAR
  Filled 2016-04-27: qty 0.5

## 2016-04-27 MED ORDER — ENOXAPARIN SODIUM 40 MG/0.4ML ~~LOC~~ SOLN
40.0000 mg | SUBCUTANEOUS | Status: DC
  Administered 2016-04-27 – 2016-04-28 (×2): 40 mg via SUBCUTANEOUS
  Filled 2016-04-27 (×2): qty 0.4

## 2016-04-27 MED ORDER — INSULIN ASPART 100 UNIT/ML ~~LOC~~ SOLN
0.0000 [IU] | Freq: Three times a day (TID) | SUBCUTANEOUS | Status: DC
  Administered 2016-04-28: 8 [IU] via SUBCUTANEOUS
  Administered 2016-04-28 – 2016-04-29 (×3): 5 [IU] via SUBCUTANEOUS
  Administered 2016-04-29: 3 [IU] via SUBCUTANEOUS

## 2016-04-27 NOTE — Progress Notes (Signed)
Subjective:    Patient ID: Andrew Weaver, male    DOB: 02/22/1919, 82 y.o.   MRN: [id number]  04/27/2016  Altered Mental Status (Trouble speaking yesterday after nap; eventually able to read. Paramedics checked BP yesterday. Shaky today. Continued trouble with speaking today.)   HPI This 82 y.o. male presents with granddaughter for evaluation of trouble speaking/word finding difficulty and aphasia.   Duration 30 minutes yesterday and resolved.  Recurrent episode today; onset at 11:00am; 90 minutes today.  When was in the kitchen to make lunch, fell and cut arm.  Daughter arrived and no words.  Paramedics came to house yesterday; worried about TIA.  Pt was not transported but decided to contact PCP.  With recurrent symptoms today, daughter transported to office; symptoms of word finding difficulties and aphasia resolved while en route.    Family heard that UTI could affect mental status; tested urine yesterday and tested positive; Dr. Felipa Eth prescribed abx but pt did not take today.    No headache.  No dizziness; no reported visual changes.  No focal weakness in an extremity; no numbness or tingling.  Denies fever/chills/sweats. Denies n/v/d.  Denies dysuria, urgency, frequency.    Maintained on Plavix for CAD; not sure if took Plavix this morning.   Review of Systems  Constitutional: Negative for activity change, appetite change, chills, diaphoresis, fatigue and fever.  Eyes: Negative for visual disturbance.  Respiratory: Negative for cough and shortness of breath.   Cardiovascular: Negative for chest pain, palpitations and leg swelling.  Gastrointestinal: Negative for abdominal pain, diarrhea, nausea and vomiting.  Endocrine: Negative for cold intolerance, heat intolerance, polydipsia, polyphagia and polyuria.  Genitourinary: Negative for dysuria and frequency.  Skin: Negative for color change, rash and wound.  Neurological: Positive for speech difficulty. Negative for  dizziness, tremors, seizures, syncope, facial asymmetry, weakness, light-headedness, numbness and headaches.  Psychiatric/Behavioral: Negative for confusion, decreased concentration, dysphoric mood, hallucinations and sleep disturbance. The patient is not nervous/anxious.     Past Medical History:  Diagnosis Date  . BPH (benign prostatic hypertrophy)   . CAD (coronary artery disease)   . Chronic kidney disease    borderline stage II/stage III  . Diabetes mellitus   . Hyperlipidemia    goal LDL less than 70  . Hypertension   . Lock jaw   . PVD (peripheral vascular disease) ([place])    Past Surgical History:  Procedure Laterality Date  . CATARACT EXTRACTION Bilateral   . CORONARY ANGIOPLASTY     with stent  . PATELLA FRACTURE SURGERY Left    Allergies  Allergen Reactions  . Lipitor [Atorvastatin] Other (See Comments)    fatigue   Current Outpatient Prescriptions  Medication Sig Dispense Refill  . atorvastatin (LIPITOR) 10 MG tablet Take 10 mg by mouth daily.    . ciprofloxacin (CIPRO) 500 MG tablet Take 500 mg by mouth 2 (two) times daily.    . clopidogrel (PLAVIX) 75 MG tablet Take 75 mg by mouth once.     . insulin regular (NOVOLIN R,HUMULIN R) 100 units/mL injection Inject 5-10 Units into the skin 3 (three) times daily before meals. 5 units if blood sugar under 150, 10 units if over 200 - estimates in between 150 and 200    . LEVEMIR 100 UNIT/ML injection Inject into the skin as directed.     . nitroGLYCERIN (NITROSTAT) 0.4 MG SL tablet Place 0.4 mg under the tongue every 5 (five) minutes as needed for chest pain (3 DOSES MAX).    Marland Kitchen  ONE TOUCH ULTRA TEST test strip TEST TID UTD  2  . losartan (COZAAR) 100 MG tablet Take 100 mg by mouth daily.    . polyethylene glycol powder (GLYCOLAX/MIRALAX) powder as needed. CONSTIPATION    . PREVIDENT 5000 BOOSTER PLUS 1.1 % PSTE Use as directed 1 application in the mouth or throat daily as needed (dental care).      No current  facility-administered medications for this visit.    Social History   Social History  . Marital status: Married    Spouse name: N/A  . Number of children: 1  . Years of education: N/A   Occupational History  . retired    Social History Main Topics  . Smoking status: Former Smoker    Types: Cigarettes    Quit date: 09/17/1991  . Smokeless tobacco: Never Used  . Alcohol use 0.0 oz/week     Comment: social  . Drug use: No  . Sexual activity: Not on file   Other Topics Concern  . Not on file   Social History Narrative  . No narrative on file   Family History  Problem Relation Age of Onset  . Heart disease Mother   . Heart disease Father   . Diabetes Father   . Heart attack Mother   . Heart attack Father        Objective:    BP (!) 171/67   Pulse 73   Temp 98.4 F (36.9 C) (Oral)   Resp 16   SpO2 96%  Physical Exam  Constitutional: He is oriented to person, place, and time. He appears well-developed and well-nourished. No distress.  HENT:  Head: Normocephalic and atraumatic.  Right Ear: External ear normal.  Left Ear: External ear normal.  Nose: Nose normal.  Mouth/Throat: Oropharynx is clear and moist.  Eyes: Conjunctivae and EOM are normal. Pupils are equal, round, and reactive to light.  Neck: Normal range of motion. Neck supple. Carotid bruit is not present. No thyromegaly present.  Cardiovascular: Normal rate, regular rhythm, normal heart sounds and intact distal pulses.  Exam reveals no gallop and no friction rub.   No murmur heard. Pulmonary/Chest: Effort normal and breath sounds normal. He has no wheezes. He has no rales.  Abdominal: Soft. Bowel sounds are normal. He exhibits no distension and no mass. There is no tenderness. There is no rebound and no guarding.  Lymphadenopathy:    He has no cervical adenopathy.  Neurological: He is alert and oriented to person, place, and time. He displays no tremor. No cranial nerve deficit. He exhibits normal muscle  tone. He displays no seizure activity. Coordination normal.  +aphasia and word-finding difficulties with acute onset during exam.  Skin: Skin is warm and dry. No rash noted. He is not diaphoretic.  Psychiatric: He has a normal mood and affect. His behavior is normal.  Nursing note and vitals reviewed.  Results for orders placed or performed in visit on 04/27/16  POCT glucose (manual entry)  Result Value Ref Range   POC Glucose 113 (A) 70 - 99 mg/dl       Assessment & Plan:   1. Word finding difficulty   2. Atherosclerosis of native coronary artery of native heart without angina pectoris   3. Diabetic polyneuropathy associated with type 2 diabetes mellitus ([place])   4. Mixed hyperlipidemia   5. Essential hypertension    -To ED for TIA/CVA evaluation. -onset of symptoms today at 11:00am; transient clearing in office with recurrence of aphasia during  exam. -transported to ED via EMS. -glucose stable at 113.   Orders Placed This Encounter  Procedures  . POCT glucose (manual entry)  . EKG 12-Lead   Meds ordered this encounter  Medications  . ciprofloxacin (CIPRO) 500 MG tablet    Sig: Take 500 mg by mouth 2 (two) times daily.    No Follow-up on file.   Jazlen Ogarro Elayne Guerin, M.D. [facility] [address] [place], [place]  [address] [phone] phone [phone] fax

## 2016-04-27 NOTE — ED Notes (Signed)
CBG 76 

## 2016-04-27 NOTE — Consult Note (Signed)
Initial Neurological Consultation                      NEURO HOSPITALIST CONSULT NOTE   Requestig physician: Dr. Alvino Chapel   Reason for Consult:  Intermittent difficulties with speech.   HPI:                                                                                                                                          Andrew Weaver is an 82 y.o. male who presents with a history of some intermittent difficulties with speech over the past 48 hours. He has a known history of a urinary tract infection but has not yet started on antibiotic therapy for this. He underwent CT of the brain which was normal. He was noted to have some mild difficulties with cognitive function on direct neurological evaluation. He had no cranial nerve deficits. He had no focal neurological complaints. Motor and sensory function was intact. Coordination was normal.   Past Medical History:  Diagnosis Date  . BPH (benign prostatic hypertrophy)   . CAD (coronary artery disease)   . Chronic kidney disease    borderline stage II/stage III  . Diabetes mellitus   . Hyperlipidemia    goal LDL less than 70  . Hypertension   . Lock jaw   . PVD (peripheral vascular disease) ([place])     Past Surgical History:  Procedure Laterality Date  . CATARACT EXTRACTION Bilateral   . CORONARY ANGIOPLASTY     with stent  . PATELLA FRACTURE SURGERY Left     MEDICATIONS:                                                                                                                     I have reviewed the patient's current medications.  Allergies  Allergen Reactions  . Lipitor [Atorvastatin] Other (See Comments)    fatigue     Social History:  reports that he quit smoking about 24 years ago. His smoking use included Cigarettes. He has never used smokeless tobacco. He reports that he drinks alcohol. He reports that he does not use drugs.  Family History  Problem Relation Age of Onset  . Heart disease Mother    . Heart attack Mother   . Heart disease Father   . Diabetes Father   . Heart attack Father      ROS:  History obtained from chart review  General ROS: negative for - chills, fatigue, fever, night sweats, weight gain or weight loss Psychological ROS: negative for - behavioral disorder, hallucinations, memory difficulties, mood swings or suicidal ideation Ophthalmic ROS: negative for - blurry vision, double vision, eye pain or loss of vision ENT ROS: negative for - epistaxis, nasal discharge, oral lesions, sore throat, tinnitus or vertigo Allergy and Immunology ROS: negative for - hives or itchy/watery eyes Hematological and Lymphatic ROS: negative for - bleeding problems, bruising or swollen lymph nodes Endocrine ROS: negative for - galactorrhea, hair pattern changes, polydipsia/polyuria or temperature intolerance Respiratory ROS: negative for - cough, hemoptysis, shortness of breath or wheezing Cardiovascular ROS: negative for - chest pain, dyspnea on exertion, edema or irregular heartbeat Gastrointestinal ROS: negative for - abdominal pain, diarrhea, hematemesis, nausea/vomiting or stool incontinence Genito-Urinary ROS: negative for - dysuria, hematuria, incontinence or urinary frequency/urgency Musculoskeletal ROS: negative for - joint swelling or muscular weakness Neurological ROS: as noted in HPI Dermatological ROS: negative for rash and skin lesion changes   General Exam                                                                                                      There were no vitals taken for this visit. HEENT-  Normocephalic, no lesions, without obvious abnormality.  Normal external eye and conjunctiva.  Normal TM's bilaterally.  Normal auditory canals and external ears. Normal external nose, mucus membranes and septum.  Normal  pharynx. Cardiovascular- regular rate and rhythm, S1, S2 normal, no murmur, click, rub or gallop, pulses palpable throughout   Lungs- chest clear, no wheezing, rales, normal symmetric air entry, Heart exam - S1, S2 normal, no murmur, no gallop, rate regular Abdomen- soft, non-tender; bowel sounds normal; no masses,  no organomegaly Extremities- less then 2 second capillary refill Lymph-no adenopathy palpable Musculoskeletal-no joint tenderness, deformity or swelling Skin-warm and dry, no hyperpigmentation, vitiligo, or suspicious lesions  Neurological Examination Mental Status: Andrew Weaver is awake and alert and following commands. He has some difficulty on more detailed Mini-Mental Status testing. Cranial Nerves: Cranial nerves II through XII are intact. Motor: Motor exam is 5 out of 5 bilaterally. Sensory: Sensation is slightly decreased distally in the lower extremities. Deep Tendon Reflexes: 1-2+ and symmetric throughout Plantars: Right: downgoing   Left: downgoing Cerebellar: normal finger-to-nose, normal rapid alternating movements and normal heel-to-shin test    Lab Results: Basic Metabolic Panel:  Recent Labs Lab 04/27/16 1458  NA 143  K 4.1  CL 104  GLUCOSE 58*  BUN 18  CREATININE 1.30*    Liver Function Tests: No results for input(s): AST, ALT, ALKPHOS, BILITOT, PROT, ALBUMIN in the last 168 hours. No results for input(s): LIPASE, AMYLASE in the last 168 hours. No results for input(s): AMMONIA in the last 168 hours.  CBC:  Recent Labs Lab 04/27/16 1458  HGB 15.0  HCT 44.0    Cardiac Enzymes: No results for input(s): CKTOTAL, CKMB, CKMBINDEX, TROPONINI in the last 168 hours.  Lipid Panel: No results for input(s): CHOL, TRIG, HDL, CHOLHDL, VLDL, LDLCALC  in the last 168 hours.  CBG:  Recent Labs Lab 04/27/16 1429  GLUCAP 12    Microbiology: Results for orders placed or performed during the hospital encounter of 01/26/12  Urine culture     Status:  None   Collection Time: 01/26/12  2:05 PM  Result Value Ref Range Status   Specimen Description URINE, CLEAN CATCH  Final   Special Requests NONE  Final   Culture  Setup Time 201,305,[id number],135  Final   Colony Count NO GROWTH  Final   Culture NO GROWTH  Final   Report Status 01/28/2012 FINAL  Final    Coagulation Studies: No results for input(s): LABPROT, INR in the last 72 hours.  Imaging: Ct Head Code Stroke Wo Contrast`  Result Date: 04/27/2016 CLINICAL DATA:  Code stroke. 82 year old male with aphasia. Initial encounter. EXAM: CT HEAD WITHOUT CONTRAST TECHNIQUE: Contiguous axial images were obtained from the base of the skull through the vertex without intravenous contrast. COMPARISON:  Brain MRI 10/11/2015.  Head CT 10/09/2015. FINDINGS: Stable paranasal sinuses and mastoids. Resolved left forehead scalp hematoma. No acute osseous abnormality identified. Negative orbits soft tissues. Calcified atherosclerosis at the skull base. Stable cerebral volume. No midline shift, mass effect, or evidence of intracranial mass lesion. Patchy and confluent bilateral cerebral white matter hypodensity appears stable. Stable involvement of the left basal ganglia. No acute intracranial hemorrhage identified. No cortically based acute infarct identified. No new or suspicious intracranial vascular hyperdensity. ASPECTS Total score (0-10 with 10 being normal): 10 IMPRESSION: 1. Stable non contrast CT appearance of the brain. No acute cortically based infarct or intracranial hemorrhage identified. 2. ASPECTS score 10. 3. Study discussed by telephone with Dr. Elson Clan on 04/27/2016 at 14:51 . Electronically Signed   By: Genevie Ann M.D.   On: 04/27/2016 14:52    Assessment/Plan:  Calum is a pleasant 82 year old gentleman who was seen in consultation with a complaint of some intermittent difficulties with speech that have been noted over the past 24-48 hours. From review of the notes he has a known history of  urinary tract infection but has not yet started on his antibiotic therapy. He had no focal neurological deficits on exam his cranial nerve exam was normal. Motor exam was normal. He has some mild distal sensory loss in the lower extremities likely related to his known history of diabetes. The CT of the brain is normal. There is no evidence of a facial on exam. Mild difficulties with cognitive function noted on Mini-Mental status testing could be related to the urinary tract infection or may be related to an underlying mild dementia.  Plan:  1. Initiate antibiotic therapy for the history of urinary tract infection  2. If the mild cognitive impairment does not completely clear with antibiotic therapy, consider outpatient neurological evaluation for possible dementia.      Audry Pecina A. Tasia Catchings, M.D. [facility] Phone: [phone]  04/27/2016, 3:03 PM

## 2016-04-27 NOTE — ED Provider Notes (Signed)
[facility] DEPT Provider Note   CSN: [id number] Arrival date & time: 04/27/16  1402  First Provider Contact:  First MD Initiated Contact with Patient 04/27/16 1410        History   Chief Complaint Chief Complaint  Patient presents with  . Aphasia    HPI Andrew Weaver is a 82 y.o. male.  The history is provided by the patient and a relative.     Andrew Weaver is a 82 y.o. male with PMH significant for CAD, DM, HLD, HTN, PVD who presents with aphasia.  Patient experienced episode of difficulty finding the correct words and expressing himself as well as difficulty reading a menu yesterday that lasted about 10 minutes.  It began again today at 11:50 AM.  They then went to UC and during the exam began having it again.  He was sent here for further evaluation.  He normally ambulates with a cane, but felt slightly off balance.  He denies any weakness, HA, CP, SOB, cough, or fever.  He did fall earlier today while making lunch and sustained skin tear to right wrist.  Denies wrist pain.  Granddaughter states they were concerned for UTI, and did multiple home strips yesterday which came back positive.  MD dropped off abx this morning, but patient has not been able to take any yet.    Past Medical History:  Diagnosis Date  . BPH (benign prostatic hypertrophy)   . CAD (coronary artery disease)   . Chronic kidney disease    borderline stage II/stage III  . Diabetes mellitus   . Hyperlipidemia    goal LDL less than 70  . Hypertension   . Lock jaw   . PVD (peripheral vascular disease) [facility])     Patient Active Problem List   Diagnosis Date Noted  . Syncope 10/09/2015  . Fall 10/09/2015  . Type II or unspecified type diabetes mellitus with neurological manifestations, not stated as uncontrolled 11/10/2013  . Diabetic polyneuropathy ([place]) 11/10/2013  . Coronary atherosclerosis of native coronary artery 11/10/2013  . Mixed hyperlipidemia 11/10/2013  . Hypertension     Past  Surgical History:  Procedure Laterality Date  . CATARACT EXTRACTION Bilateral   . CORONARY ANGIOPLASTY     with stent  . PATELLA FRACTURE SURGERY Left        Home Medications    Prior to Admission medications   Medication Sig Start Date End Date Taking? Authorizing Provider  atorvastatin (LIPITOR) 10 MG tablet Take 10 mg by mouth daily.    Historical Provider, MD  ciprofloxacin (CIPRO) 500 MG tablet Take 500 mg by mouth 2 (two) times daily.    Historical Provider, MD  clopidogrel (PLAVIX) 75 MG tablet Take 75 mg by mouth once.  08/23/13   Historical Provider, MD  insulin regular (NOVOLIN R,HUMULIN R) 100 units/mL injection Inject 5-10 Units into the skin 3 (three) times daily before meals. 5 units if blood sugar under 150, 10 units if over 200 - estimates in between 150 and 200    Historical Provider, MD  LEVEMIR 100 UNIT/ML injection Inject into the skin as directed.  02/27/16   Historical Provider, MD  losartan (COZAAR) 100 MG tablet Take 100 mg by mouth daily.    Historical Provider, MD  nitroGLYCERIN (NITROSTAT) 0.4 MG SL tablet Place 0.4 mg under the tongue every 5 (five) minutes as needed for chest pain (3 DOSES MAX).    Historical Provider, MD  ONE TOUCH ULTRA TEST test strip TEST TID UTD 03/14/16  Historical Provider, MD  polyethylene glycol powder (GLYCOLAX/MIRALAX) powder as needed. CONSTIPATION 12/14/15   Historical Provider, MD  PREVIDENT 5000 BOOSTER PLUS 1.1 % PSTE Use as directed 1 application in the mouth or throat daily as needed (dental care).  10/24/13   Historical Provider, MD    Family History Family History  Problem Relation Age of Onset  . Heart disease Mother   . Heart attack Mother   . Heart disease Father   . Diabetes Father   . Heart attack Father     Social History Social History  Substance Use Topics  . Smoking status: Former Smoker    Types: Cigarettes    Quit date: 09/17/1991  . Smokeless tobacco: Never Used  . Alcohol use 0.0 oz/week     Comment:  social     Allergies   Lipitor [atorvastatin]   Review of Systems Review of Systems All other systems negative unless otherwise stated in HPI   Physical Exam Updated Vital Signs Temp 99.1 F (37.3 C) (Rectal)   Physical Exam  Constitutional: He is oriented to person, place, and time. He appears well-developed and well-nourished.  Non-toxic appearance. He does not have a sickly appearance. He does not appear ill.  HENT:  Head: Normocephalic and atraumatic.  Mouth/Throat: Oropharynx is clear and moist.  Eyes: Conjunctivae are normal. Pupils are equal, round, and reactive to light.  Neck: Normal range of motion. Neck supple.  Cardiovascular: Normal rate and regular rhythm.   Pulmonary/Chest: Effort normal and breath sounds normal. No accessory muscle usage or stridor. No respiratory distress. He has no wheezes. He has no rhonchi. He has no rales.  Abdominal: Soft. Bowel sounds are normal. He exhibits no distension. There is no tenderness.  Musculoskeletal: Normal range of motion.  FAROM of right wrist.  Non-tender.  No anatomical snuffbox tenderness.  Superficial skin tear to lateral distal ulna.   Lymphadenopathy:    He has no cervical adenopathy.  Neurological: He is alert and oriented to person, place, and time.  Mental Status:   AOx3.  Speech clear without dysarthria. Will use inappropriate/wrong words at time.  Cranial Nerves:  I-not tested  II-PERRLA  III, IV, VI-EOMs intact  V-temporal and masseter strength intact  VII-symmetrical facial movements intact, no facial droop  VIII-hearing grossly intact bilaterally  IX, X-gag intact  XI-strength of sternomastoid and trapezius muscles 5/5  XII-tongue midline Motor:   Good muscle bulk and tone  Strength 5/5 bilaterally in upper and lower extremities   Cerebellar--intact RAMs, finger to nose intact bilaterally.  Heel to shin intact bilaterally.   No pronator drift Sensory:  Intact in upper and lower extremities     Skin: Skin is warm and dry.  Psychiatric: He has a normal mood and affect. His behavior is normal.     ED Treatments / Results  Labs (all labs ordered are listed, but only abnormal results are displayed) Labs Reviewed  CBC - Abnormal; Notable for the following:       Result Value   MCV 101.9 (*)    All other components within normal limits  COMPREHENSIVE METABOLIC PANEL - Abnormal; Notable for the following:    Glucose, Bld 62 (*)    Creatinine, Ser 1.35 (*)    GFR calc non Af Amer 43 (*)    GFR calc Af Amer 50 (*)    All other components within normal limits  I-STAT CHEM 8, ED - Abnormal; Notable for the following:    Creatinine, Ser 1.30 (*)  Glucose, Bld 58 (*)    All other components within normal limits  PROTIME-INR  APTT  DIFFERENTIAL  ETHANOL  URINE RAPID DRUG SCREEN, HOSP PERFORMED  URINALYSIS, ROUTINE W REFLEX MICROSCOPIC (NOT AT [facility])  Randolm Idol, ED  CBG MONITORING, ED    EKG  EKG Interpretation  Date/Time:  Saturday April 27 2016 14:22:39 EDT Ventricular Rate:  66 PR Interval:    QRS Duration: 87 QT Interval:  398 QTC Calculation: 417 R Axis:   42 Text Interpretation:  Atrial fibrillation Borderline repolarization abnormality Confirmed by Alvino Chapel  MD, NATHAN [phone]) on 04/27/2016 2:35:44 PM       Radiology Dg Chest 2 View  Result Date: 04/27/2016 CLINICAL DATA:  Stroke EXAM: CHEST  2 VIEW COMPARISON:  03/31/2015 FINDINGS: Left hemidiaphragm remains elevated. Minimal atelectasis at the left base. Opacities at the right base have resolved. Upper lungs remain clear. No pneumothorax. Normal heart size. IMPRESSION: Resolved airspace disease at the right base. Minimal atelectasis at the left base related to elevation of the left hemidiaphragm. Electronically Signed   By: Marybelle Killings M.D.   On: 04/27/2016 15:28   Ct Head Code Stroke Wo Contrast`  Result Date: 04/27/2016 CLINICAL DATA:  Code stroke. 82 year old male with aphasia. Initial  encounter. EXAM: CT HEAD WITHOUT CONTRAST TECHNIQUE: Contiguous axial images were obtained from the base of the skull through the vertex without intravenous contrast. COMPARISON:  Brain MRI 10/11/2015.  Head CT 10/09/2015. FINDINGS: Stable paranasal sinuses and mastoids. Resolved left forehead scalp hematoma. No acute osseous abnormality identified. Negative orbits soft tissues. Calcified atherosclerosis at the skull base. Stable cerebral volume. No midline shift, mass effect, or evidence of intracranial mass lesion. Patchy and confluent bilateral cerebral white matter hypodensity appears stable. Stable involvement of the left basal ganglia. No acute intracranial hemorrhage identified. No cortically based acute infarct identified. No new or suspicious intracranial vascular hyperdensity. ASPECTS Total score (0-10 with 10 being normal): 10 IMPRESSION: 1. Stable non contrast CT appearance of the brain. No acute cortically based infarct or intracranial hemorrhage identified. 2. ASPECTS score 10. 3. Study discussed by telephone with Dr. Elson Clan on 04/27/2016 at 14:51 . Electronically Signed   By: Genevie Ann M.D.   On: 04/27/2016 14:52    Procedures Procedures (including critical care time)  Medications Ordered in ED Medications  Tdap (BOOSTRIX) injection 0.5 mL (not administered)     Initial Impression / Assessment and Plan / ED Course  I have reviewed the triage vital signs and the nursing notes.  Pertinent labs & imaging results that were available during my care of the patient were reviewed by me and considered in my medical decision making (see chart for details).  Clinical Course   Patient presents with aphasia and difficulty walking, onset today 11:50 AM.  No other neurological deficits noted on exam.  Code stroke activated.  EKG shows atrial fibrillation, he is hemodynamically stable and rate controlled.  No hx of AF.  Only on plavix and ASA.  CHADS-VASC score 5.  DDx includes CVA/TIA, UTI,  metabolic derangement.  Labs and imaging ordered.  CT head unremarkable.  Patient seen by Neuro, doubt CVA, suspect UTI and recommend outpatient neuro f/u if sxs persist despite abx.  Labs without acute abnormalities.  CXR no acute abnormality.  UA pending.    Patient care hand off to oncoming provider, Arlean Hopping, PA-C, at shift change.  UA pending.  However, I anticipate admission  for IV abx for likely UTI given mental impairment,  gait instabililties leading to fall, and comorbidities. AF could possibly be related to infection or new onset.  Case has been discussed with and seen by Dr. Alvino Chapel who agrees with the above plan.    Final Clinical Impressions(s) / ED Diagnoses   Final diagnoses:  Aphasia  Fall, initial encounter  Skin tear of forearm without complication, right, initial encounter    New Prescriptions New Prescriptions   No medications on file     Gloriann Loan, PA-C 04/27/16 [place], MD 04/28/16 [phone]

## 2016-04-27 NOTE — ED Triage Notes (Addendum)
Pt sent here from [facility] for intermittent aphasia workup.  Pt had 1 bout of aphasia yesterday and 2 today that have since cleared up on their own.  Per family, pt was supposed to start on an antibiotic today for a UTI, but has not started yet. Pt is on plavix and is afib on the monitor.

## 2016-04-27 NOTE — ED Notes (Signed)
Gave pt milk, peanut butter, and crackers per Dr. Lacinda Axon

## 2016-04-27 NOTE — ED Provider Notes (Signed)
Patient presents with least 3 episodes of aphasia and confusion prior to arrival.  Gloriann Loan, PA-C's HPI: "Andrew Weaver is a 82 y.o. male with PMH significant for CAD, DM, HLD, HTN, PVD who presents with aphasia.  Patient experienced episode of difficulty finding the correct words and expressing himself as well as difficulty reading a menu yesterday that lasted about 10 minutes.  It began again today at 11:50 AM.  They then went to UC and during the exam began having it again.  He was sent here for further evaluation.  He normally ambulates with a cane, but felt slightly off balance.  He denies any weakness, HA, CP, SOB, cough, or fever.  He did fall earlier today while making lunch and sustained skin tear to right wrist.  Denies wrist pain.  Granddaughter states they were concerned for UTI, and did multiple home strips yesterday which came back positive.  MD dropped off abx this morning, but patient has not been able to take any yet."  Past Medical History:  Diagnosis Date  . BPH (benign prostatic hypertrophy)   . CAD (coronary artery disease)   . Chronic kidney disease    borderline stage II/stage III  . Diabetes mellitus   . Hyperlipidemia    goal LDL less than 70  . Hypertension   . Lock jaw   . PVD (peripheral vascular disease) ([facility])     Physical Exam  BP 176/87   Pulse 68   Temp 99.1 F (37.3 C) (Rectal)   Resp 13   SpO2 97%   Physical Exam  Constitutional: He is oriented to person, place, and time. He appears well-developed and well-nourished. No distress.  HENT:  Head: Normocephalic and atraumatic.  Eyes: Conjunctivae are normal.  Neck: Neck supple.  Cardiovascular: Normal rate, regular rhythm, normal heart sounds and intact distal pulses.   Pulmonary/Chest: Effort normal and breath sounds normal. No respiratory distress.  Abdominal: Soft. There is no tenderness. There is no guarding.  Musculoskeletal: He exhibits no edema or tenderness.  Lymphadenopathy:    He has  no cervical adenopathy.  Neurological: He is alert and oriented to person, place, and time.  Skin: Skin is warm and dry. He is not diaphoretic.  Psychiatric: He has a normal mood and affect. His behavior is normal.  Nursing note and vitals reviewed.   ED Course  Procedures   Labs Reviewed  CBC - Abnormal; Notable for the following:       Result Value   MCV 101.9 (*)    All other components within normal limits  COMPREHENSIVE METABOLIC PANEL - Abnormal; Notable for the following:    Glucose, Bld 62 (*)    Creatinine, Ser 1.35 (*)    GFR calc non Af Amer 43 (*)    GFR calc Af Amer 50 (*)    All other components within normal limits  URINALYSIS, ROUTINE W REFLEX MICROSCOPIC (NOT AT [facility]) - Abnormal; Notable for the following:    Glucose, UA 100 (*)    Protein, ur 100 (*)    All other components within normal limits  URINE MICROSCOPIC-ADD ON - Abnormal; Notable for the following:    Squamous Epithelial / LPF 0-5 (*)    Bacteria, UA RARE (*)    All other components within normal limits  I-STAT CHEM 8, ED - Abnormal; Notable for the following:    Creatinine, Ser 1.30 (*)    Glucose, Bld 58 (*)    All other components within normal limits  URINE CULTURE  ETHANOL  PROTIME-INR  APTT  DIFFERENTIAL  URINE RAPID DRUG SCREEN, HOSP PERFORMED  I-STAT TROPOININ, ED  CBG MONITORING, ED   BUN  Date Value Ref Range Status  04/27/2016 18 6 - 20 mg/dL Final  04/27/2016 16 6 - 20 mg/dL Final  10/11/2015 15 6 - 20 mg/dL Final  10/10/2015 12 6 - 20 mg/dL Final   Creatinine, Ser  Date Value Ref Range Status  04/27/2016 1.30 (H) 0.61 - 1.24 mg/dL Final  04/27/2016 1.35 (H) 0.61 - 1.24 mg/dL Final  10/11/2015 1.42 (H) 0.61 - 1.24 mg/dL Final  10/10/2015 1.39 (H) 0.61 - 1.24 mg/dL Final     Dg Chest 2 View  Result Date: 04/27/2016 CLINICAL DATA:  Stroke EXAM: CHEST  2 VIEW COMPARISON:  03/31/2015 FINDINGS: Left hemidiaphragm remains elevated. Minimal atelectasis at the left base.  Opacities at the right base have resolved. Upper lungs remain clear. No pneumothorax. Normal heart size. IMPRESSION: Resolved airspace disease at the right base. Minimal atelectasis at the left base related to elevation of the left hemidiaphragm. Electronically Signed   By: Marybelle Killings M.D.   On: 04/27/2016 15:28   Ct Head Code Stroke Wo Contrast`  Result Date: 04/27/2016 CLINICAL DATA:  Code stroke. 82 year old male with aphasia. Initial encounter. EXAM: CT HEAD WITHOUT CONTRAST TECHNIQUE: Contiguous axial images were obtained from the base of the skull through the vertex without intravenous contrast. COMPARISON:  Brain MRI 10/11/2015.  Head CT 10/09/2015. FINDINGS: Stable paranasal sinuses and mastoids. Resolved left forehead scalp hematoma. No acute osseous abnormality identified. Negative orbits soft tissues. Calcified atherosclerosis at the skull base. Stable cerebral volume. No midline shift, mass effect, or evidence of intracranial mass lesion. Patchy and confluent bilateral cerebral white matter hypodensity appears stable. Stable involvement of the left basal ganglia. No acute intracranial hemorrhage identified. No cortically based acute infarct identified. No new or suspicious intracranial vascular hyperdensity. ASPECTS Total score (0-10 with 10 being normal): 10 IMPRESSION: 1. Stable non contrast CT appearance of the brain. No acute cortically based infarct or intracranial hemorrhage identified. 2. ASPECTS score 10. 3. Study discussed by telephone with Dr. Elson Clan on 04/27/2016 at 14:51 . Electronically Signed   By: Genevie Ann M.D.   On: 04/27/2016 14:52     EKG Interpretation  Date/Time:  Saturday April 27 2016 14:22:39 EDT Ventricular Rate:  66 PR Interval:    QRS Duration: 87 QT Interval:  398 QTC Calculation: 417 R Axis:   42 Text Interpretation:  Atrial fibrillation Borderline repolarization abnormality Confirmed by Alvino Chapel  MD, Ovid Curd [phone]) on 04/27/2016 2:35:44 PM       This patients CHA2DS2-VASc Score and unadjusted Ischemic Stroke Rate (% per year) is equal to 7.2 % stroke rate/year from a score of 5  Above score calculated as 1 point each if present [CHF, HTN, DM, Vascular=MI/PAD/Aortic Plaque, Age if 65-74, or Male] Above score calculated as 2 points each if present [Age > 75, or Stroke/TIA/TE]   MDM 3:47 PM Took patient care handoff report from Gloriann Loan, PA-C. Called code stroke based on aphasia. Neuro states no stroke, possibly from UTI? Has an ABX prescribed yesterday for UTI, has not taken any ABX yet. Had fall today. Plan: Review UA. If urinalysis suggests UTI, admit for IV ABX and safety due to weakness and falls stemming from the infection.   Findings and plan of care discussed with Nat Christen, MD. Dr. Lacinda Axon personally evaluated and examined this patient.  UA does not  suggest UTI. Regardless, patient will need to be admitted for observation due to safety concerns. Suspect the patient may have had some recurrent TIAs.  Problems: 1. Aphasia: May be recurrent TIAs. 2. New onset Afib: CHADS2-VASc score of 5. Will need anticoagulation upgrade from plavix, such as Xarelto.  5:22 PM Spoke with Dr. Renaee Munda, hospitalist, who agreed to admit the patient to telemetry observation.   Vitals:   04/27/16 1507 04/27/16 1509 04/27/16 1551 04/27/16 1555  BP:    176/87  Pulse:   72 68  Resp:   21 13  Temp: 99.1 F (37.3 C) 99.1 F (37.3 C)    TempSrc:  Rectal    SpO2:   98% 97%        Lorayne Bender, PA-C 04/27/16 [place], PA-C 04/27/16 [place], MD 04/28/16 [phone]

## 2016-04-27 NOTE — ED Notes (Signed)
Admitting MD at bedside.

## 2016-04-27 NOTE — H&P (Signed)
History and Physical  Andrew Weaver KCL:[id number] DOB: 01-Aug-1919 DOA: 04/27/2016   PCP: Mathews Argyle, MD   Patient coming from: Home/ALF   Chief Complaint: aphasia, falls   HPI: Andrew Weaver is a 82 y.o. male with medical history significant for DM, CAD, HTN being admitted for fall at home today associated with confusion and word finding difficulty. History provided by patient and his granddaughter. Patient tells me is not clear exactly why he is in the ER, he goes off on some tangents about his blood sugar. Grand daughter tells me he was fine until yesterday when he seemed to want to speak but could not. This resolved after a few minutes and he was mostly back to normal. Today she was summoned to their home again as he had fallen, and was having the same symptoms again. Patient denies losing consciousness and says it was a mechanical fall, recalls the whole thing. Denies injury from fall, chest pain, cough, fevers, nausea, vomiting or SOB. No dysuria, frequency of urination. Home test kit that was found in their home was used to check for UTI and was possibly positive.   ED Course: In the ER, patient was seen by Neurology after having normal Ct head and it was not felt that patient is having CVA, felt to be a mild cognitive impairment.    Review of Systems: Please see HPI for pertinent positives and negatives. A complete 10 system review of systems are otherwise negative.  Past Medical History:  Diagnosis Date  . BPH (benign prostatic hypertrophy)   . CAD (coronary artery disease)   . Chronic kidney disease    borderline stage II/stage III  . Diabetes mellitus   . Hyperlipidemia    goal LDL less than 70  . Hypertension   . Lock jaw   . PVD (peripheral vascular disease) ([place])    Past Surgical History:  Procedure Laterality Date  . CATARACT EXTRACTION Bilateral   . CORONARY ANGIOPLASTY     with stent  . PATELLA FRACTURE SURGERY Left     Social History:  reports  that he quit smoking about 24 years ago. His smoking use included Cigarettes. He has never used smokeless tobacco. He reports that he drinks alcohol. He reports that he does not use drugs.   Allergies  Allergen Reactions  . Lipitor [Atorvastatin] Other (See Comments)    fatigue    Family History  Problem Relation Age of Onset  . Heart disease Mother   . Heart attack Mother   . Heart disease Father   . Diabetes Father   . Heart attack Father      Prior to Admission medications   Medication Sig Start Date End Date Taking? Authorizing Provider  ciprofloxacin (CIPRO) 500 MG tablet Take 500 mg by mouth 2 (two) times daily.    Historical Provider, MD  clopidogrel (PLAVIX) 75 MG tablet Take 75 mg by mouth once.  08/23/13   Historical Provider, MD  LEVEMIR 100 UNIT/ML injection Inject into the skin as directed.  02/27/16   Historical Provider, MD  losartan (COZAAR) 100 MG tablet Take 100 mg by mouth daily.    Historical Provider, MD  nitroGLYCERIN (NITROSTAT) 0.4 MG SL tablet Place 0.4 mg under the tongue every 5 (five) minutes as needed for chest pain (3 DOSES MAX).    Historical Provider, MD  ONE TOUCH ULTRA TEST test strip TEST TID UTD 03/14/16   Historical Provider, MD  polyethylene glycol powder (GLYCOLAX/MIRALAX) powder as needed. CONSTIPATION 12/14/15  Historical Provider, MD    Physical Exam: BP 166/72   Pulse 77   Temp 99.1 F (37.3 C) (Rectal)   Resp 24   SpO2 97%   General:  Alert, oriented, calm, in no acute distress, at times goes off on tangents and having some trouble remembering dates, names of medications, etc.  Eyes: pupils round and reactive to light and accomodation, clear sclerea Neck: supple, no masses, trachea mildline  Cardiovascular: RRR, no murmurs or rubs, no peripheral edema  Respiratory: clear to auscultation bilaterally, no wheezes, no crackles  Abdomen: soft, nontender, nondistended, normal bowel tones heard  Skin: dry, no rashes  Musculoskeletal: no  joint effusions, normal range of motion  Psychiatric: appropriate affect, normal speech  Neurologic: extraocular muscles intact, clear speech, moving all extremities with intact sensorium            Labs on Admission:  Basic Metabolic Panel:  Recent Labs Lab 04/27/16 1445 04/27/16 1458  NA 138 143  K 4.1 4.1  CL 103 104  CO2 28  --   GLUCOSE 62* 58*  BUN 16 18  CREATININE 1.35* 1.30*  CALCIUM 8.9  --    Liver Function Tests:  Recent Labs Lab 04/27/16 1445  AST 35  ALT 20  ALKPHOS 89  BILITOT 0.7  PROT 6.7  ALBUMIN 3.6   No results for input(s): LIPASE, AMYLASE in the last 168 hours. No results for input(s): AMMONIA in the last 168 hours. CBC:  Recent Labs Lab 04/27/16 1445 04/27/16 1458  WBC 8.2  --   NEUTROABS 6.7  --   HGB 14.6 15.0  HCT 43.7 44.0  MCV 101.9*  --   PLT 211  --    Cardiac Enzymes: No results for input(s): CKTOTAL, CKMB, CKMBINDEX, TROPONINI in the last 168 hours.  BNP (last 3 results) No results for input(s): BNP in the last 8760 hours.  ProBNP (last 3 results) No results for input(s): PROBNP in the last 8760 hours.  CBG:  Recent Labs Lab 04/27/16 1429 04/27/16 1747  GLUCAP 76 104*    Radiological Exams on Admission: Dg Chest 2 View  Result Date: 04/27/2016 CLINICAL DATA:  Stroke EXAM: CHEST  2 VIEW COMPARISON:  03/31/2015 FINDINGS: Left hemidiaphragm remains elevated. Minimal atelectasis at the left base. Opacities at the right base have resolved. Upper lungs remain clear. No pneumothorax. Normal heart size. IMPRESSION: Resolved airspace disease at the right base. Minimal atelectasis at the left base related to elevation of the left hemidiaphragm. Electronically Signed   By: Marybelle Killings M.D.   On: 04/27/2016 15:28   Ct Head Code Stroke Wo Contrast`  Result Date: 04/27/2016 CLINICAL DATA:  Code stroke. 82 year old male with aphasia. Initial encounter. EXAM: CT HEAD WITHOUT CONTRAST TECHNIQUE: Contiguous axial images were  obtained from the base of the skull through the vertex without intravenous contrast. COMPARISON:  Brain MRI 10/11/2015.  Head CT 10/09/2015. FINDINGS: Stable paranasal sinuses and mastoids. Resolved left forehead scalp hematoma. No acute osseous abnormality identified. Negative orbits soft tissues. Calcified atherosclerosis at the skull base. Stable cerebral volume. No midline shift, mass effect, or evidence of intracranial mass lesion. Patchy and confluent bilateral cerebral white matter hypodensity appears stable. Stable involvement of the left basal ganglia. No acute intracranial hemorrhage identified. No cortically based acute infarct identified. No new or suspicious intracranial vascular hyperdensity. ASPECTS Total score (0-10 with 10 being normal): 10 IMPRESSION: 1. Stable non contrast CT appearance of the brain. No acute cortically based infarct or intracranial  hemorrhage identified. 2. ASPECTS score 10. 3. Study discussed by telephone with Dr. Elson Clan on 04/27/2016 at 14:51 . Electronically Signed   By: Genevie Ann M.D.   On: 04/27/2016 14:52    Assessment/Plan Present on Admission: . Aphasia . A-fib ([place]) . Hypertension . Fall  Active Problems:   Aphasia - unclear etiology, stroke/CVA vs infection. I feel infection is unlikely given paucity of history of lab findings today that would corroborate; note his UA is unremarkable. He could be having small CVA/TIAs especially in view of what appears to be new onset Afib. - admit obs - fall precautions - telemetry - pt refuses MRI adamantly - could consider repeat CT in 48 hours - check Hb A1c, fasting lipid panel - continue home Plavix (taken for CAD) - permissive hypertension for 24 hours    A-fib ([facility]) - new onset - rate controlled currently, could add beta blockade in case of elevated rates - discussed AC with patient/family, would like to stick with plavix for now given history of falls - check TSH - check TTE    Hypertension -  hold home losartan for now    Fall - PT/OT, fall precautions  DVT prophylaxis: Lovenox for PPX.   Code Status: DNR, confirmed with patient and granddaughter in ER   Family Communication: Granddaughter present.   Disposition Plan: Likely to home in 1-2 days.   Consults called: None   Admission status: Obs Telemetry   Time spent: 46 minutes  Davonne Jarnigan Marry Guan MD [facility] Pager [phone]  If 7PM-7AM, please contact night-coverage [email or web address] Password [facility]  04/27/2016, 6:07 PM

## 2016-04-28 ENCOUNTER — Observation Stay (HOSPITAL_COMMUNITY): Payer: Medicare Other

## 2016-04-28 ENCOUNTER — Encounter (HOSPITAL_COMMUNITY): Payer: Medicare Other

## 2016-04-28 DIAGNOSIS — R4701 Aphasia: Secondary | ICD-10-CM | POA: Diagnosis not present

## 2016-04-28 DIAGNOSIS — W19XXXD Unspecified fall, subsequent encounter: Secondary | ICD-10-CM

## 2016-04-28 DIAGNOSIS — I2510 Atherosclerotic heart disease of native coronary artery without angina pectoris: Secondary | ICD-10-CM | POA: Diagnosis present

## 2016-04-28 DIAGNOSIS — I10 Essential (primary) hypertension: Secondary | ICD-10-CM

## 2016-04-28 DIAGNOSIS — I480 Paroxysmal atrial fibrillation: Secondary | ICD-10-CM | POA: Diagnosis not present

## 2016-04-28 DIAGNOSIS — Z87891 Personal history of nicotine dependence: Secondary | ICD-10-CM | POA: Diagnosis not present

## 2016-04-28 DIAGNOSIS — G3184 Mild cognitive impairment, so stated: Secondary | ICD-10-CM | POA: Diagnosis present

## 2016-04-28 DIAGNOSIS — I4891 Unspecified atrial fibrillation: Secondary | ICD-10-CM | POA: Diagnosis present

## 2016-04-28 DIAGNOSIS — I129 Hypertensive chronic kidney disease with stage 1 through stage 4 chronic kidney disease, or unspecified chronic kidney disease: Secondary | ICD-10-CM | POA: Diagnosis present

## 2016-04-28 DIAGNOSIS — E782 Mixed hyperlipidemia: Secondary | ICD-10-CM

## 2016-04-28 DIAGNOSIS — Z79899 Other long term (current) drug therapy: Secondary | ICD-10-CM | POA: Diagnosis not present

## 2016-04-28 DIAGNOSIS — E1122 Type 2 diabetes mellitus with diabetic chronic kidney disease: Secondary | ICD-10-CM | POA: Diagnosis present

## 2016-04-28 DIAGNOSIS — Z955 Presence of coronary angioplasty implant and graft: Secondary | ICD-10-CM | POA: Diagnosis not present

## 2016-04-28 DIAGNOSIS — N183 Chronic kidney disease, stage 3 (moderate): Secondary | ICD-10-CM | POA: Diagnosis present

## 2016-04-28 DIAGNOSIS — Z794 Long term (current) use of insulin: Secondary | ICD-10-CM | POA: Diagnosis not present

## 2016-04-28 DIAGNOSIS — E1142 Type 2 diabetes mellitus with diabetic polyneuropathy: Secondary | ICD-10-CM | POA: Diagnosis not present

## 2016-04-28 DIAGNOSIS — E1151 Type 2 diabetes mellitus with diabetic peripheral angiopathy without gangrene: Secondary | ICD-10-CM | POA: Diagnosis present

## 2016-04-28 DIAGNOSIS — Z66 Do not resuscitate: Secondary | ICD-10-CM | POA: Diagnosis present

## 2016-04-28 DIAGNOSIS — Z7902 Long term (current) use of antithrombotics/antiplatelets: Secondary | ICD-10-CM | POA: Diagnosis not present

## 2016-04-28 LAB — BASIC METABOLIC PANEL
Anion gap: 5 (ref 5–15)
BUN: 14 mg/dL (ref 6–20)
CHLORIDE: 104 mmol/L (ref 101–111)
CO2: 24 mmol/L (ref 22–32)
Calcium: 8.4 mg/dL — ABNORMAL LOW (ref 8.9–10.3)
Creatinine, Ser: 1.23 mg/dL (ref 0.61–1.24)
GFR calc Af Amer: 56 mL/min — ABNORMAL LOW (ref >60)
GFR calc non Af Amer: 48 mL/min — ABNORMAL LOW (ref >60)
GLUCOSE: 194 mg/dL — AB (ref 65–99)
POTASSIUM: 3.6 mmol/L (ref 3.5–5.1)
Sodium: 133 mmol/L — ABNORMAL LOW (ref 135–145)

## 2016-04-28 LAB — VITAMIN B12: Vitamin B-12: 193 pg/mL (ref 180–914)

## 2016-04-28 LAB — GLUCOSE, CAPILLARY
GLUCOSE-CAPILLARY: 233 mg/dL — AB (ref 65–99)
Glucose-Capillary: 215 mg/dL — ABNORMAL HIGH (ref 65–99)
Glucose-Capillary: 207 mg/dL — ABNORMAL HIGH (ref 65–99)
Glucose-Capillary: 267 mg/dL — ABNORMAL HIGH (ref 65–99)

## 2016-04-28 LAB — VAS US CAROTID
LCCADDIAS: 12 cm/s
LEFT ECA DIAS: -7 cm/s
LEFT VERTEBRAL DIAS: 5 cm/s
LICADDIAS: -19 cm/s
LICADSYS: -77 cm/s
LICAPDIAS: -12 cm/s
LICAPSYS: -56 cm/s
Left CCA dist sys: 74 cm/s
Left CCA prox dias: 8 cm/s
Left CCA prox sys: 69 cm/s
RIGHT ECA DIAS: -10 cm/s
RIGHT VERTEBRAL DIAS: 7 cm/s
Right CCA prox dias: 11 cm/s
Right CCA prox sys: 61 cm/s
Right cca dist sys: -61 cm/s

## 2016-04-28 LAB — URINE CULTURE: CULTURE: NO GROWTH

## 2016-04-28 LAB — CBC
HEMATOCRIT: 39.2 % (ref 39.0–52.0)
Hemoglobin: 12.9 g/dL — ABNORMAL LOW (ref 13.0–17.0)
MCH: 33.3 pg (ref 26.0–34.0)
MCHC: 32.9 g/dL (ref 30.0–36.0)
MCV: 101.3 fL — AB (ref 78.0–100.0)
Platelets: 178 10*3/uL (ref 150–400)
RBC: 3.87 MIL/uL — ABNORMAL LOW (ref 4.22–5.81)
RDW: 13.6 % (ref 11.5–15.5)
WBC: 5.2 10*3/uL (ref 4.0–10.5)

## 2016-04-28 MED ORDER — INSULIN DETEMIR 100 UNIT/ML ~~LOC~~ SOLN
5.0000 [IU] | Freq: Every day | SUBCUTANEOUS | Status: DC
Start: 2016-04-28 — End: 2016-04-28
  Filled 2016-04-28: qty 0.05

## 2016-04-28 MED ORDER — LOSARTAN POTASSIUM 50 MG PO TABS
100.0000 mg | ORAL_TABLET | Freq: Every day | ORAL | Status: DC
Start: 2016-04-28 — End: 2016-04-29
  Administered 2016-04-28 – 2016-04-29 (×2): 100 mg via ORAL
  Filled 2016-04-28 (×2): qty 2

## 2016-04-28 MED ORDER — CLOPIDOGREL BISULFATE 75 MG PO TABS
75.0000 mg | ORAL_TABLET | Freq: Every day | ORAL | Status: DC
  Administered 2016-04-28 – 2016-04-29 (×2): 75 mg via ORAL
  Filled 2016-04-28 (×2): qty 1

## 2016-04-28 MED ORDER — ATORVASTATIN CALCIUM 10 MG PO TABS: 10.0000 mg | ORAL_TABLET | Freq: Every day | ORAL | Status: DC

## 2016-04-28 MED ORDER — INSULIN DETEMIR 100 UNIT/ML ~~LOC~~ SOLN
8.0000 [IU] | Freq: Every day | SUBCUTANEOUS | Status: DC
  Administered 2016-04-28: 8 [IU] via SUBCUTANEOUS
  Filled 2016-04-28 (×2): qty 0.08

## 2016-04-28 NOTE — Evaluation (Signed)
Physical Therapy Evaluation Patient Details Name: Andrew Weaver MRN: [id number] DOB: [birth year] Today's Date: 04/28/2016   History of Present Illness  82 y.o. male with medical history significant for DM, CAD, HTN being admitted for fall at home today associated with confusion and word finding difficulty. CT on 8/12 no acute cortically based infarct or intracranial hemorrhage identified.  Clinical Impression  Patient presents with decreased independence with mobility due to deficits listed in PT problem list.  He will benefit from skilled PT in the acute setting to allow return home.  Have discussed with patient several options for d/c and he is not comprehending that he needs more help at home.  Feel SNF is safest d/c plan, however, he is likely to refuse as he feels he needs to be there for his wife.  Would, therefore recommend follow up therapy at his facility (states they have PT available at [facility],) and increasing hired help hours so he will have supervision for mobility at least initially.      Follow Up Recommendations Supervision/Assistance - 24 hour;SNF (see clinical impression statement for further recs)    Equipment Recommendations  None recommended by PT    Recommendations for Other Services       Precautions / Restrictions Precautions Precautions: Fall      Mobility  Bed Mobility   Bed Mobility: Supine to Sit     Supine to sit: Supervision        Transfers   Equipment used: Straight cane Transfers: Sit to/from Stand Sit to Stand: Supervision         General transfer comment: impulsive and initially with posterior lean   Ambulation/Gait Ambulation/Gait assistance: Min assist Ambulation Distance (Feet): 120 Feet Assistive device: Straight cane Gait Pattern/deviations: Step-through pattern;Decreased stride length;Shuffle;Trunk flexed;Drifts right/left     General Gait Details: utilizing cane more initially than later, but demonstrates flexed  posture despite cues, note sliding feet on floor, however able to improve foot clearance with cues, but states due to neuropathy   Stairs            Wheelchair Mobility    Modified Rankin (Stroke Patients Only)       Balance Overall balance assessment: Needs assistance   Sitting balance-Leahy Scale: Good     Standing balance support: Single extremity supported Standing balance-Leahy Scale: Poor Standing balance comment: initially in standing leaning back with legs braced against bed and with one hand on cane; able to improve anterior weight shift when given support from behind                             Pertinent Vitals/Pain Pain Assessment: No/denies pain    Home Living Family/patient expects to be discharged to:: Private residence ([facility]) Living Arrangements: Spouse/significant other Available Help at Discharge: Family Type of Home: Independent living facility Home Access: Level entry     Home Layout: One level Home Equipment: Cane - single point;Grab bars - toilet;Grab bars - tub/shower;Shower seat Additional Comments: Pt lives with wife who is dependent for IADL's they have help from aides (including grandaughte) 7 days a week 2 hours in morning, 2 hours in evening    Prior Function Level of Independence: Independent with assistive device(s)         Comments: Pt reports he uses cane for mobility. Completes BADL independently. Incependent living facility provides meals and cleans apartment every other week.     Hand Dominance   Dominant  Hand: Left    Extremity/Trunk Assessment   Upper Extremity Assessment: Defer to OT evaluation           Lower Extremity Assessment: RLE deficits/detail;LLE deficits/detail RLE Deficits / Details: functional weakness in ankle DF's LLE Deficits / Details: functional weakness in ankle DF's  Cervical / Trunk Assessment: Kyphotic  Communication   Communication: HOH  Cognition  Arousal/Alertness: Awake/alert Behavior During Therapy: Impulsive Overall Cognitive Status: No family/caregiver present to determine baseline cognitive functioning Area of Impairment: Attention;Following commands;Memory;Safety/judgement;Awareness;Problem solving   Current Attention Level: Sustained Memory: Decreased short-term memory Following Commands: Follows one step commands inconsistently Safety/Judgement: Decreased awareness of safety;Decreased awareness of deficits Awareness: Intellectual   General Comments: Patient aware that he is unsteady, but when discussing how to change level of care for safety at home, he could not understand why they would need that since he was happy they had been able to decrease the amount of hired help they recieve.     General Comments General comments (skin integrity, edema, etc.): Patient relates he only falls due to low blood sugar and has fallen twice    Exercises        Assessment/Plan    PT Assessment Patient needs continued PT services  PT Diagnosis Abnormality of gait;Generalized weakness   PT Problem List Decreased strength;Decreased mobility;Decreased safety awareness;Decreased balance;Decreased cognition;Decreased knowledge of use of DME;Impaired sensation;Decreased knowledge of precautions  PT Treatment Interventions DME instruction;Therapeutic activities;Gait training;Therapeutic exercise;Patient/family education;Balance training;Functional mobility training   PT Goals (Current goals can be found in the Care Plan section) Acute Rehab PT Goals Patient Stated Goal: return home PT Goal Formulation: With patient Time For Goal Achievement: 05/04/16 Potential to Achieve Goals: Fair    Frequency Min 3X/week   Barriers to discharge Decreased caregiver support Right now only has help for his wife 10a-12p and 6p-8p.  Discussed hiring help for helping him more esp as he reports getting up at [place].    Co-evaluation               End  of Session Equipment Utilized During Treatment: Gait belt Activity Tolerance: Patient limited by lethargy Patient left: in bed;with call bell/phone within reach;with bed alarm set Nurse Communication: Mobility status    Functional Assessment Tool Used: Clinical Judgement Functional Limitation: Mobility: Walking and moving around Mobility: Walking and Moving Around Current Status [id number]): At least 20 percent but less than 40 percent impaired, limited or restricted Mobility: Walking and Moving Around Goal Status [phone]): At least 1 percent but less than 20 percent impaired, limited or restricted    Time: 1555-1420 PT Time Calculation (min) (ACUTE ONLY): 1345 min   Charges:   PT Evaluation $PT Eval Moderate Complexity: 1 Procedure PT Treatments $Gait Training: 8-22 mins   PT G Codes:   PT G-Codes **NOT FOR INPATIENT CLASS** Functional Assessment Tool Used: Clinical Judgement Functional Limitation: Mobility: Walking and moving around Mobility: Walking and Moving Around Current Status [id number]): At least 20 percent but less than 40 percent impaired, limited or restricted Mobility: Walking and Moving Around Goal Status [phone]): At least 1 percent but less than 20 percent impaired, limited or restricted    Reginia Naas 04/28/2016, 4:03 PM  Magda Kiel, [place] 04/28/2016

## 2016-04-28 NOTE — Progress Notes (Signed)
PROGRESS NOTE    Andrew Weaver  [id number] DOB: 05/14/1919 DOA: 04/27/2016 PCP: Mathews Argyle, MD    Assessment & Plan:   Principal Problem:   Aphasia Active Problems:   Hypertension   Diabetic polyneuropathy ([place])   Coronary atherosclerosis of native coronary artery   Mixed hyperlipidemia   Fall   A-fib [facility])  #1 aphasia Questionable etiology. Concern for possible acute CVA. Head CT negative for any acute infarct. 2-D echo pending. Carotid Dopplers pending. Fasting lipid panel with a LDL of 75. Continue home dose Lipitor 10 mg daily. Continue home dose Plavix. Patient refusing MRI secondary to claustrophobia. Patient has been seen in consultation by neurology who feel patient might have mild difficulties with cognitive function and may be related to possible UTI with a probable underlying mild dementia. Urinalysis is unremarkable. Patient with no evidence of infection. No need for antibiotics at this time. Follow. Patient will likely need to follow-up with neurology in the outpatient setting for evaluation for possible dementia. Neurology following and appreciate input and recommendations.  #2 hypertension Stable. Cozaar on hold.  #3 coronary artery disease Stable. Continue home regimen Plavix, Lipitor.  #4 falls PT/OT. CT head negative for any acute abnormalities. Follow.  #5 diabetes mellitus Sliding scale insulin.  #6 ?? atrial fibrillation Currently rate controlled. Anticoagulation was discussed with patient and family and due to history of falls patient and family declining anticoagulation at this time and wanted to continue Plavix. 2-D echo pending. TSH within normal limits at 3.048. Follow.  #7 hyperlipidemia LDL of 75. Continue home dose Lipitor.   DVT prophylaxis: Lovenox Code Status: DO NOT RESUSCITATE Family Communication: Updated patient. No family at bedside. Disposition Plan: Home in medically stable and workup complete and per  neurology.   Consultants:   Neurologist: Dr. Tasia Catchings 04/27/2016  Procedures:   CT head 04/27/2016   Carotid Dopplers pending.   2-D echo pending.  Antimicrobials:   None   Subjective: Patient denies any chest pain. No shortness of breath. No speech abnormalities. Patient states he feels is at his baseline. Patient states she is claustrophobic and a such cannot undergo MRI.  Objective: Vitals:   04/28/16 0030 04/28/16 0230 04/28/16 0430 04/28/16 0949  BP: (!) 146/80 136/77 (!) 154/83 (!) 143/70  Pulse: 60 64 (!) 54 64  Resp: 18 18 18 18   Temp: 97.9 F (36.6 C) 98.6 F (37 C) 98.6 F (37 C) 97.6 F (36.4 C)  TempSrc: Oral Oral Oral Oral  SpO2: 99% 97% 95% 99%  Weight:      Height:        Intake/Output Summary (Last 24 hours) at 04/28/16 1110 Last data filed at 04/28/16 0900  Gross per 24 hour  Intake              360 ml  Output              450 ml  Net              -90 ml   Filed Weights   04/27/16 1824  Weight: 80.7 kg (178 lb)    Examination:  General exam: Appears calm and comfortable  Respiratory system: Clear to auscultation. Respiratory effort normal. Cardiovascular system: S1 & S2 heard, RRR. No JVD, murmurs, rubs, gallops or clicks. No pedal edema. Gastrointestinal system: Abdomen is nondistended, soft and nontender. No organomegaly or masses felt. Normal bowel sounds heard. Central nervous system: Alert and oriented. No focal neurological deficits. Extremities: Symmetric 5 x 5 power.  Skin: No rashes, lesions or ulcers Psychiatry: Judgement and insight appear normal. Mood & affect appropriate.     Data Reviewed: I have personally reviewed following labs and imaging studies  CBC:  Recent Labs Lab 04/27/16 1445 04/27/16 1458 04/27/16 1743 04/28/16 0745  WBC 8.2  --  9.2 5.2  NEUTROABS 6.7  --   --   --   HGB 14.6 15.0 14.0 12.9*  HCT 43.7 44.0 42.3 39.2  MCV 101.9*  --  101.2* 101.3*  PLT 211  --  193 0000000   Basic Metabolic  Panel:  Recent Labs Lab 04/27/16 1445 04/27/16 1458 04/27/16 1743 04/28/16 0745  NA 138 143  --  133*  K 4.1 4.1  --  3.6  CL 103 104  --  104  CO2 28  --   --  24  GLUCOSE 62* 58*  --  194*  BUN 16 18  --  14  CREATININE 1.35* 1.30* 1.20 1.23  CALCIUM 8.9  --   --  8.4*   GFR: Estimated Creatinine Clearance: 41.9 mL/min (by C-G formula based on SCr of 1.23 mg/dL). Liver Function Tests:  Recent Labs Lab 04/27/16 1445  AST 35  ALT 20  ALKPHOS 89  BILITOT 0.7  PROT 6.7  ALBUMIN 3.6   No results for input(s): LIPASE, AMYLASE in the last 168 hours. No results for input(s): AMMONIA in the last 168 hours. Coagulation Profile:  Recent Labs Lab 04/27/16 1445  INR 1.03   Cardiac Enzymes: No results for input(s): CKTOTAL, CKMB, CKMBINDEX, TROPONINI in the last 168 hours. BNP (last 3 results) No results for input(s): PROBNP in the last 8760 hours. HbA1C: No results for input(s): HGBA1C in the last 72 hours. CBG:  Recent Labs Lab 04/27/16 1429 04/27/16 1747 04/27/16 2100 04/28/16 0632  GLUCAP 76 104* 309* 215*   Lipid Profile:  Recent Labs  04/27/16 1743  CHOL 143  HDL 51  LDLCALC 75  TRIG 86  CHOLHDL 2.8   Thyroid Function Tests:  Recent Labs  04/27/16 2100  TSH 3.048   Anemia Panel:  Recent Labs  04/28/16 0937  VITAMINB12 193   Sepsis Labs: No results for input(s): PROCALCITON, LATICACIDVEN in the last 168 hours.  Recent Results (from the past 240 hour(s))  Urine culture     Status: None (Preliminary result)   Collection Time: 04/27/16  4:05 PM  Result Value Ref Range Status   Specimen Description URINE, CLEAN CATCH  Final   Special Requests NONE  Final   Culture PENDING  Incomplete   Report Status PENDING  Incomplete         Radiology Studies: Dg Chest 2 View  Result Date: 04/27/2016 CLINICAL DATA:  Stroke EXAM: CHEST  2 VIEW COMPARISON:  03/31/2015 FINDINGS: Left hemidiaphragm remains elevated. Minimal atelectasis at the  left base. Opacities at the right base have resolved. Upper lungs remain clear. No pneumothorax. Normal heart size. IMPRESSION: Resolved airspace disease at the right base. Minimal atelectasis at the left base related to elevation of the left hemidiaphragm. Electronically Signed   By: Marybelle Killings M.D.   On: 04/27/2016 15:28   Ct Head Code Stroke Wo Contrast`  Result Date: 04/27/2016 CLINICAL DATA:  Code stroke. 82 year old male with aphasia. Initial encounter. EXAM: CT HEAD WITHOUT CONTRAST TECHNIQUE: Contiguous axial images were obtained from the base of the skull through the vertex without intravenous contrast. COMPARISON:  Brain MRI 10/11/2015.  Head CT 10/09/2015. FINDINGS: Stable paranasal sinuses and mastoids. Resolved  left forehead scalp hematoma. No acute osseous abnormality identified. Negative orbits soft tissues. Calcified atherosclerosis at the skull base. Stable cerebral volume. No midline shift, mass effect, or evidence of intracranial mass lesion. Patchy and confluent bilateral cerebral white matter hypodensity appears stable. Stable involvement of the left basal ganglia. No acute intracranial hemorrhage identified. No cortically based acute infarct identified. No new or suspicious intracranial vascular hyperdensity. ASPECTS Total score (0-10 with 10 being normal): 10 IMPRESSION: 1. Stable non contrast CT appearance of the brain. No acute cortically based infarct or intracranial hemorrhage identified. 2. ASPECTS score 10. 3. Study discussed by telephone with Dr. Elson Clan on 04/27/2016 at 14:51 . Electronically Signed   By: Genevie Ann M.D.   On: 04/27/2016 14:52        Scheduled Meds: . atorvastatin  10 mg Oral q1800  . clopidogrel  75 mg Oral Daily  . enoxaparin (LOVENOX) injection  40 mg Subcutaneous Q24H  . insulin aspart  0-15 Units Subcutaneous TID WC  . sodium chloride flush  3 mL Intravenous Q12H   Continuous Infusions:    LOS: 0 days    Time spent: 19  mins    Carlosdaniel Grob, MD [facility] Pager [phone]  If 7PM-7AM, please contact night-coverage [email or web address] Password [email or web address] 04/28/2016, 11:10 AM

## 2016-04-28 NOTE — Progress Notes (Signed)
Patient had late dinner tray bedtime blood sugar 309.No bedtime sliding scale insulin ordered.Text paged D.Crosley.No new orders received will continue to monitor.

## 2016-04-28 NOTE — Progress Notes (Addendum)
*  PRELIMINARY RESULTS* Vascular Ultrasound Carotid Duplex (Doppler) has been completed.   Findings suggest 1-39% internal carotid artery stenosis bilaterally. Vertebral arteries are patent with antegrade flow.  The left vertebral artery exhibits atypical flow.  04/28/2016 3:54 PM Maudry Mayhew, BS, RVT, RDCS, RDMS

## 2016-04-28 NOTE — Evaluation (Signed)
Occupational Therapy Evaluation Patient Details Name: Andrew Weaver MRN: [id number] DOB: [birth year] Today's Date: 04/28/2016    History of Present Illness 82 y.o. male with medical history significant for DM, CAD, HTN being admitted for fall at home today associated with confusion and word finding difficulty. CT on 8/12 no acute cortically based infarct or intracranial hemorrhage identified.   Clinical Impression   Pt reports he was independent with ADL PTA. Currently pt requires mod assist for functional mobility, set up and supervision for seated UB ADL, and min assist for LB ADL. Pt impulsive with decreased safety awareness throughout session. Pt noted to have mild confusion and difficulty following commands during functional activities. Pt found to be incontinent of bowel at start of session; RN reports 1 similar episode this AM. Pt reports that he cares for his wife who requires physical assist at times. At this time, feel pt is unsafe to return home unless he has 24/7 supervision from other family members. Recommending SNF for follow up in order to maximize independence and safety with ADL and functional mobility prior to return home. Pt would benefit from continued skilled OT to address established goals.    Follow Up Recommendations  SNF;Supervision/Assistance - 24 hour    Equipment Recommendations  None recommended by OT    Recommendations for Other Services PT consult     Precautions / Restrictions Precautions Precautions: Fall Restrictions Weight Bearing Restrictions: No      Mobility Bed Mobility Overal bed mobility: Needs Assistance Bed Mobility: Supine to Sit     Supine to sit: Min guard;HOB elevated     General bed mobility comments: Min guard for safety. No physical assist required. HOB elevated with use of bed rails.  Transfers Overall transfer level: Needs assistance Equipment used: 1 person hand held assist Transfers: Sit to/from Stand Sit to Stand:  Min assist         General transfer comment: Min assist for balance in static standing. Pt impulsive with transfers; attempting to stand on own multiple times during session.    Balance Overall balance assessment: Needs assistance Sitting-balance support: Feet supported;No upper extremity supported Sitting balance-Leahy Scale: Good     Standing balance support: No upper extremity supported;During functional activity Standing balance-Leahy Scale: Fair Standing balance comment: Able to assist with peri care in standing. No UE support and min exernal assist provided.                            ADL Overall ADL's : Needs assistance/impaired Eating/Feeding: Set up;Sitting   Grooming: Minimal assistance;Standing;Wash/dry hands Grooming Details (indicate cue type and reason): min assist for balance in standing Upper Body Bathing: Set up;Supervision/ safety;Sitting   Lower Body Bathing: Minimal assistance;Sit to/from stand Lower Body Bathing Details (indicate cue type and reason): Assist for cleaning after pt found to be incontinent of bowel. Upper Body Dressing : Set up;Supervision/safety;Sitting Upper Body Dressing Details (indicate cue type and reason): to doff/don hospital gown Lower Body Dressing: Minimal assistance;Sit to/from stand Lower Body Dressing Details (indicate cue type and reason): for balance in standing. Able to pull up socks sitting EOB prior to functional mobility. Toilet Transfer: Moderate assistance;Ambulation;Regular Toilet (hand held assist)   Toileting- Clothing Manipulation and Hygiene: Minimal assistance;Sit to/from stand       Functional mobility during ADLs: Moderate assistance (hand held assist) General ADL Comments: Pt impulsive with transfers and easily confused with simple one step commands. Pt found to be incontinent  of bowel upon OT arrival; pt unable to identify that he had a BM (RN aware and reports similiar episode this AM). Attempted use  of RW with functional mobility; seemed to be getting in the way and transitioned to hand held assist.      Vision Vision Assessment?: Yes Eye Alignment: Within Functional Limits Ocular Range of Motion: Within Functional Limits Tracking/Visual Pursuits: Able to track stimulus in all quads without difficulty Convergence: Within functional limits Visual Fields: No apparent deficits   Perception     Praxis      Pertinent Vitals/Pain Pain Assessment: No/denies pain     Hand Dominance Left   Extremity/Trunk Assessment Upper Extremity Assessment Upper Extremity Assessment: Overall WFL for tasks assessed   Lower Extremity Assessment Lower Extremity Assessment: Defer to PT evaluation   Cervical / Trunk Assessment Cervical / Trunk Assessment: Kyphotic   Communication Communication Communication: HOH   Cognition Arousal/Alertness: Awake/alert Behavior During Therapy: Impulsive Overall Cognitive Status: No family/caregiver present to determine baseline cognitive functioning Area of Impairment: Attention;Following commands;Memory;Safety/judgement;Awareness;Problem solving   Current Attention Level: Sustained Memory: Decreased short-term memory Following Commands: Follows one step commands inconsistently Safety/Judgement: Decreased awareness of safety;Decreased awareness of deficits Awareness: Intellectual Problem Solving: Difficulty sequencing;Requires verbal cues;Requires tactile cues General Comments: Pt found incontinent of bowel at start of session; unable to identify that he had a BM. Pt with some confusion throughout session but able to state what happended with fall. Pt inconsistently following one step commands.    General Comments       Exercises       Shoulder Instructions      Home Living Family/patient expects to be discharged to:: Other (Comment) ([facility] per RN.) Living Arrangements: Spouse/significant other Available Help at Discharge:  Family;Available 24 hours/day (wife) Type of Home: Independent living facility Home Access: Level entry     Home Layout: One level     Bathroom Shower/Tub: Occupational psychologist: Handicapped height     Home Equipment: Cane - single point;Grab bars - toilet;Grab bars - tub/shower;Shower seat   Additional Comments: Pt lives with wife who is dependent on him for ADL.      Prior Functioning/Environment Level of Independence: Independent with assistive device(s)        Comments: Pt reports he uses cane for mobility. Completes BADL independently. Incependent living facility provides meals and cleans apartment every other week.    OT Diagnosis: Generalized weakness;Cognitive deficits;Altered mental status   OT Problem List: Impaired balance (sitting and/or standing);Decreased cognition;Decreased safety awareness;Decreased knowledge of use of DME or AE   OT Treatment/Interventions: Self-care/ADL training;Energy conservation;DME and/or AE instruction;Patient/family education;Balance training;Therapeutic activities;Cognitive remediation/compensation    OT Goals(Current goals can be found in the care plan section) Acute Rehab OT Goals Patient Stated Goal: return home OT Goal Formulation: With patient Time For Goal Achievement: 05/12/16 Potential to Achieve Goals: Good ADL Goals Pt Will Perform Grooming: with modified independence;standing Pt Will Perform Upper Body Bathing: with modified independence;sitting Pt Will Perform Lower Body Bathing: with modified independence;sit to/from stand Pt Will Transfer to Toilet: with modified independence;ambulating;regular height toilet Pt Will Perform Toileting - Clothing Manipulation and hygiene: with modified independence;sit to/from stand Additional ADL Goal #1: Pt will demonstrate anticipatory awareness during ADL task 100% of the time.  OT Frequency: Min 2X/week   Barriers to D/C: Decreased caregiver support  pt reports wife  is disabled and he has to provide physical assist for her       Co-evaluation  End of Session Equipment Utilized During Treatment: Gait belt;Rolling walker Nurse Communication: Mobility status  Activity Tolerance: Patient tolerated treatment well Patient left: in chair;with call bell/phone within reach;with chair alarm set   Time: [id number] OT Time Calculation (min): 25 min Charges:  OT General Charges $OT Visit: 1 Procedure OT Evaluation $OT Eval Moderate Complexity: 1 Procedure OT Treatments $Self Care/Home Management : 8-22 mins G-Codes: OT G-codes **NOT FOR INPATIENT CLASS** Functional Assessment Tool Used: Clinical judgement Functional Limitation: Self care Self Care Current Status [id number]): At least 20 percent but less than 40 percent impaired, limited or restricted Self Care Goal Status [id number]): At least 1 percent but less than 20 percent impaired, limited or restricted   Binnie Kand M.S., OTR/L Pager: [phone]  04/28/2016, 12:06 PM

## 2016-04-29 ENCOUNTER — Other Ambulatory Visit (HOSPITAL_COMMUNITY): Payer: Medicare Other

## 2016-04-29 DIAGNOSIS — I480 Paroxysmal atrial fibrillation: Secondary | ICD-10-CM

## 2016-04-29 LAB — HEMOGLOBIN A1C
HEMOGLOBIN A1C: 7.8 % — AB (ref 4.8–5.6)
MEAN PLASMA GLUCOSE: 177 mg/dL

## 2016-04-29 LAB — BASIC METABOLIC PANEL
ANION GAP: 6 (ref 5–15)
BUN: 14 mg/dL (ref 6–20)
CALCIUM: 8.6 mg/dL — AB (ref 8.9–10.3)
CO2: 26 mmol/L (ref 22–32)
Chloride: 104 mmol/L (ref 101–111)
Creatinine, Ser: 1.3 mg/dL — ABNORMAL HIGH (ref 0.61–1.24)
GFR calc Af Amer: 52 mL/min — ABNORMAL LOW (ref >60)
GFR, EST NON AFRICAN AMERICAN: 45 mL/min — AB (ref >60)
GLUCOSE: 203 mg/dL — AB (ref 65–99)
Potassium: 4.2 mmol/L (ref 3.5–5.1)
SODIUM: 136 mmol/L (ref 135–145)

## 2016-04-29 LAB — RPR: RPR Ser Ql: NONREACTIVE

## 2016-04-29 LAB — FOLATE RBC
FOLATE, HEMOLYSATE: 444.2 ng/mL
FOLATE, RBC: 1125 ng/mL (ref >498)
HEMATOCRIT: 39.5 % (ref 37.5–51.0)

## 2016-04-29 LAB — GLUCOSE, CAPILLARY
GLUCOSE-CAPILLARY: 194 mg/dL — AB (ref 65–99)
Glucose-Capillary: 204 mg/dL — ABNORMAL HIGH (ref 65–99)

## 2016-04-29 NOTE — Care Management Note (Addendum)
Case Management Note  Patient Details  Name: Andrew Weaver MRN: [id number] Date of Birth: 1919/08/25  Subjective/Objective:                    Action/Plan: Pt is discharging back to [place] with his Granddaughter transporting him. CM met with the patient and his Granddaughter [name] and went over the plan. The patient was adamant that he did not want to go to SNF for rehab. [name] states she will be able to be at her grandfathers home more hours during the day. She also requested and received a private duty list and informed that private duty is private pay. CM provided her a list of [place] agencies in the [place] area. She selected [facility]. Mary with [facility] notified and CM awaiting acceptance of the referral. Bedside RN updated.   Addendum: [facility] with [facility] accepted the referral.   Expected Discharge Date:                  Expected Discharge Plan:  [place]  In-House Referral:     Discharge planning Services  CM Consult  Post Acute Care Choice:  Home Health Choice offered to:  Patient, Adult Children  DME Arranged:    DME Agency:     HH Arranged:  RN, PT, OT, Nurse's Aide, Social Work [employer] Agency:  [facility] [employer]  Status of Service:  Completed, signed off  If discussed at [employer], dates discussed:    Additional Comments:  Pollie Friar, RN 04/29/2016, 2:42 PM

## 2016-04-29 NOTE — Progress Notes (Signed)
Pt has not had his inpatient echocardiogram. Spoke with Dr Grandville Silos and he stated that it was okay to let him go but he needs to make an appointment to have one outpatient. Information given to Granddaughter. Pt understood all of discharge instructions.

## 2016-04-29 NOTE — Clinical Social Work Note (Signed)
CSW consulted for New SNF. CSW discussed pt with RNCM. Pt is refusing SNF and will return home. CSW is signing off as no further needs identified.   Darden Dates, MSW, [place] Social Worker  [phone]

## 2016-04-29 NOTE — Discharge Summary (Signed)
Physician Discharge Summary  Andrew Weaver KGU:[id number] DOB: 02/08/1919 DOA: 04/27/2016  PCP: Mathews Argyle, MD  Admit date: 04/27/2016 Discharge date: 04/29/2016  Time spent: 65 minutes  Recommendations for Outpatient Follow-up:  1. Follow-up with Mathews Argyle, MD in1 to 2 weeks. On follow-up patient will need to be assessed for dementia or referral made to neurology as outpatient for further evaluation. 2-D echo was ordered, however patient was intent on being discharged home and as 2-D echo had not been done by day of discharge it could be done in the outpatient setting if deemed appropriate by his PCP.   Discharge Diagnoses:  Principal Problem:   Aphasia Active Problems:   Hypertension   Diabetic polyneuropathy ([place])   Coronary atherosclerosis of native coronary artery   Mixed hyperlipidemia   Fall   A-fib [facility])   Discharge Condition: Stable and improved  Diet recommendation: Carb modified  Filed Weights   04/27/16 1824 04/29/16 0506  Weight: 80.7 kg (178 lb) 78.3 kg (172 lb 9.6 oz)    History of present illness:  Per Dr Thersa Salt Chaplin is a 82 y.o. male with medical history significant for DM, CAD, HTN being admitted for fall at home on the day of admission, associated with confusion and word finding difficulty. History provided by patient and his granddaughter. Patient  told admitting physician, is not clear exactly why he is in the ER, he goes off on some tangents about his blood sugar. Grand daughter stated he was fine until one day prior to admission, when he seemed to want to speak but could not. This resolved after a few minutes and he was mostly back to normal. On the day of admission, she was summoned to their home again as he had fallen, and was having the same symptoms again. Patient denied losing consciousness and stated, it was a mechanical fall, recalls the whole thing. Denied injury from fall, chest pain, cough, fevers, nausea,  vomiting or SOB. No dysuria, frequency of urination. Home test kit that was found in their home was used to check for UTI and was possibly positive.   ED Course: In the ER, patient was seen by Neurology after having normal Ct head and it was not felt that patient is having CVA, felt to be a mild cognitive impairment.    Hospital Course:  #1 aphasia Questionable etiology. Concern for possible acute CVA. Head CT negative for any acute infarct. Carotid Dopplers were done with no significant ICA stenosis. 2-D echo was ordered however not done during this hospitalization. Patient was intent on being discharged in a such a 2-D echo may be done in the outpatient setting. Fasting lipid panel with a LDL of 75. Continued on home dose Lipitor 10 mg daily. Continued on home dose Plavix. Patient refusing MRI secondary to claustrophobia. Patient has been seen in consultation by neurology who feel patient might have mild difficulties with cognitive function and may be related to possible UTI with a probable underlying mild dementia. Urinalysis was unremarkable. Patient with no evidence of infection. No need for antibiotics. Patient will likely need to follow-up with neurology in the outpatient setting for evaluation for possible dementia.   #2 hypertension Stable. Patient's Cozaar was held during the hospitalization resumed on discharge.  #3 coronary artery disease Stable. Continued on home regimen Plavix, Lipitor.  #4 falls PT/OT. CT head negative for any acute abnormalities. Carotid Dopplers with no significant ICA stenosis. 2-D echo was ordered however not done during this hospitalization may be  done in the outpatient setting as patient was intent on being discharged. Patient was seen by physical therapy who recommended skilled nursing facility, however patient adamantly refused and wanted to be discharged back to his independent living and as such home health therapies were ordered.  #5 diabetes  mellitus Patient was maintained on a sliding scale insulin.   #6 ?? atrial fibrillation Currently rate controlled. Anticoagulation was discussed with patient and family and due to history of falls patient and family declined anticoagulation at this time and wanted to continue Plavix. 2-D echo was ordered however not done during this admission may be done as outpatient. TSH within normal limits at 3.048. Follow.  #7 hyperlipidemia LDL of 75. Continued on home dose Lipitor.    Procedures:  CT head 04/27/2016   Carotid Dopplers pending.   Consultations:  Neurologist: Dr. Tasia Catchings 04/27/2016  Discharge Exam: Vitals:   04/29/16 0506 04/29/16 0948  BP: (!) 160/83 (!) 141/72  Pulse: 61 63  Resp: 16 18  Temp: 97.4 F (36.3 C) 98.2 F (36.8 C)    General: NAD Cardiovascular: RRR Respiratory: CTAB  Discharge Instructions   Discharge Instructions    Diet Carb Modified    Complete by:  As directed   Discharge instructions    Complete by:  As directed   Follow up with Mathews Argyle, MD in 1-2 weeks. Follow up with neurology.   Increase activity slowly    Complete by:  As directed     Current Discharge Medication List    CONTINUE these medications which have NOT CHANGED   Details  atorvastatin (LIPITOR) 10 MG tablet Take 10 mg by mouth every evening. 4pm    clopidogrel (PLAVIX) 75 MG tablet Take 75 mg by mouth daily.     insulin detemir (LEVEMIR) 100 UNIT/ML injection Inject 20-25 Units into the skin daily with breakfast.     insulin regular (NOVOLIN R,HUMULIN R) 100 units/mL injection Inject 5-10 Units into the skin 3 (three) times daily before meals.     nitroGLYCERIN (NITROSTAT) 0.4 MG SL tablet Place 0.4 mg under the tongue every 5 (five) minutes as needed for chest pain (3 DOSES MAX).    polyethylene glycol powder (GLYCOLAX/MIRALAX) powder Take 17 g by mouth daily as needed (constipation). CONSTIPATION    losartan (COZAAR) 100 MG tablet Take 100 mg by  mouth every evening. 4pm    ONE TOUCH ULTRA TEST test strip TEST TID UTD Refills: 2      STOP taking these medications     ciprofloxacin (CIPRO) 500 MG tablet        Allergies  Allergen Reactions  . Lipitor [Atorvastatin] Other (See Comments)    Fatigue - pt tolerates 10 mg dose   Follow-up Information    Mathews Argyle, MD. Schedule an appointment as soon as possible for a visit in 1 week(s).   Specialty:  Internal Medicine Why:  f/u in 1-2 weeks. Contact information: [address] [place] [address] [phone]            The results of significant diagnostics from this hospitalization (including imaging, microbiology, ancillary and laboratory) are listed below for reference.    Significant Diagnostic Studies: Dg Chest 2 View  Result Date: 04/27/2016 CLINICAL DATA:  Stroke EXAM: CHEST  2 VIEW COMPARISON:  03/31/2015 FINDINGS: Left hemidiaphragm remains elevated. Minimal atelectasis at the left base. Opacities at the right base have resolved. Upper lungs remain clear. No pneumothorax. Normal heart size. IMPRESSION: Resolved airspace disease  at the right base. Minimal atelectasis at the left base related to elevation of the left hemidiaphragm. Electronically Signed   By: Marybelle Killings M.D.   On: 04/27/2016 15:28   Ct Head Code Stroke Wo Contrast`  Result Date: 04/27/2016 CLINICAL DATA:  Code stroke. 82 year old male with aphasia. Initial encounter. EXAM: CT HEAD WITHOUT CONTRAST TECHNIQUE: Contiguous axial images were obtained from the base of the skull through the vertex without intravenous contrast. COMPARISON:  Brain MRI 10/11/2015.  Head CT 10/09/2015. FINDINGS: Stable paranasal sinuses and mastoids. Resolved left forehead scalp hematoma. No acute osseous abnormality identified. Negative orbits soft tissues. Calcified atherosclerosis at the skull base. Stable cerebral volume. No midline shift, mass effect, or evidence of intracranial mass lesion.  Patchy and confluent bilateral cerebral white matter hypodensity appears stable. Stable involvement of the left basal ganglia. No acute intracranial hemorrhage identified. No cortically based acute infarct identified. No new or suspicious intracranial vascular hyperdensity. ASPECTS Total score (0-10 with 10 being normal): 10 IMPRESSION: 1. Stable non contrast CT appearance of the brain. No acute cortically based infarct or intracranial hemorrhage identified. 2. ASPECTS score 10. 3. Study discussed by telephone with Dr. Elson Clan on 04/27/2016 at 14:51 . Electronically Signed   By: Genevie Ann M.D.   On: 04/27/2016 14:52    Microbiology: Recent Results (from the past 240 hour(s))  Urine culture     Status: None   Collection Time: 04/27/16  4:05 PM  Result Value Ref Range Status   Specimen Description URINE, CLEAN CATCH  Final   Special Requests NONE  Final   Culture NO GROWTH  Final   Report Status 04/28/2016 FINAL  Final     Labs: Basic Metabolic Panel:  Recent Labs Lab 04/27/16 1445 04/27/16 1458 04/27/16 1743 04/28/16 0745 04/29/16 0543  NA 138 143  --  133* 136  K 4.1 4.1  --  3.6 4.2  CL 103 104  --  104 104  CO2 28  --   --  24 26  GLUCOSE 62* 58*  --  194* 203*  BUN 16 18  --  14 14  CREATININE 1.35* 1.30* 1.20 1.23 1.30*  CALCIUM 8.9  --   --  8.4* 8.6*   Liver Function Tests:  Recent Labs Lab 04/27/16 1445  AST 35  ALT 20  ALKPHOS 89  BILITOT 0.7  PROT 6.7  ALBUMIN 3.6   No results for input(s): LIPASE, AMYLASE in the last 168 hours. No results for input(s): AMMONIA in the last 168 hours. CBC:  Recent Labs Lab 04/27/16 1445 04/27/16 1458 04/27/16 1743 04/28/16 0745  WBC 8.2  --  9.2 5.2  NEUTROABS 6.7  --   --   --   HGB 14.6 15.0 14.0 12.9*  HCT 43.7 44.0 42.3 39.2  MCV 101.9*  --  101.2* 101.3*  PLT 211  --  193 178   Cardiac Enzymes: No results for input(s): CKTOTAL, CKMB, CKMBINDEX, TROPONINI in the last 168 hours. BNP: BNP (last 3  results) No results for input(s): BNP in the last 8760 hours.  ProBNP (last 3 results) No results for input(s): PROBNP in the last 8760 hours.  CBG:  Recent Labs Lab 04/28/16 1147 04/28/16 1640 04/28/16 2126 04/29/16 0631 04/29/16 1121  GLUCAP 207* 267* 233* 194* 204*       Signed:  Rykin Route MD.  [facility] 04/29/2016, 1:40 PM

## 2016-05-02 DIAGNOSIS — I129 Hypertensive chronic kidney disease with stage 1 through stage 4 chronic kidney disease, or unspecified chronic kidney disease: Secondary | ICD-10-CM | POA: Diagnosis not present

## 2016-05-02 DIAGNOSIS — R413 Other amnesia: Secondary | ICD-10-CM | POA: Diagnosis not present

## 2016-05-02 DIAGNOSIS — G458 Other transient cerebral ischemic attacks and related syndromes: Secondary | ICD-10-CM | POA: Diagnosis not present

## 2016-05-02 DIAGNOSIS — N183 Chronic kidney disease, stage 3 (moderate): Secondary | ICD-10-CM | POA: Diagnosis not present

## 2016-05-02 DIAGNOSIS — E1121 Type 2 diabetes mellitus with diabetic nephropathy: Secondary | ICD-10-CM | POA: Diagnosis not present

## 2016-05-02 DIAGNOSIS — E1142 Type 2 diabetes mellitus with diabetic polyneuropathy: Secondary | ICD-10-CM | POA: Diagnosis not present

## 2016-05-02 DIAGNOSIS — Z794 Long term (current) use of insulin: Secondary | ICD-10-CM | POA: Diagnosis not present

## 2016-05-02 DIAGNOSIS — I480 Paroxysmal atrial fibrillation: Secondary | ICD-10-CM | POA: Diagnosis not present

## 2016-05-07 DIAGNOSIS — E1142 Type 2 diabetes mellitus with diabetic polyneuropathy: Secondary | ICD-10-CM | POA: Diagnosis not present

## 2016-05-07 DIAGNOSIS — R4701 Aphasia: Secondary | ICD-10-CM | POA: Diagnosis not present

## 2016-05-07 DIAGNOSIS — I4891 Unspecified atrial fibrillation: Secondary | ICD-10-CM | POA: Diagnosis not present

## 2016-05-07 DIAGNOSIS — E785 Hyperlipidemia, unspecified: Secondary | ICD-10-CM | POA: Diagnosis not present

## 2016-05-07 DIAGNOSIS — I10 Essential (primary) hypertension: Secondary | ICD-10-CM | POA: Diagnosis not present

## 2016-05-07 DIAGNOSIS — R531 Weakness: Secondary | ICD-10-CM | POA: Diagnosis not present

## 2016-05-08 DIAGNOSIS — R531 Weakness: Secondary | ICD-10-CM | POA: Diagnosis not present

## 2016-05-08 DIAGNOSIS — E1142 Type 2 diabetes mellitus with diabetic polyneuropathy: Secondary | ICD-10-CM | POA: Diagnosis not present

## 2016-05-08 DIAGNOSIS — I10 Essential (primary) hypertension: Secondary | ICD-10-CM | POA: Diagnosis not present

## 2016-05-08 DIAGNOSIS — R4701 Aphasia: Secondary | ICD-10-CM | POA: Diagnosis not present

## 2016-05-08 DIAGNOSIS — I4891 Unspecified atrial fibrillation: Secondary | ICD-10-CM | POA: Diagnosis not present

## 2016-05-08 DIAGNOSIS — E785 Hyperlipidemia, unspecified: Secondary | ICD-10-CM | POA: Diagnosis not present

## 2016-05-09 DIAGNOSIS — E785 Hyperlipidemia, unspecified: Secondary | ICD-10-CM | POA: Diagnosis not present

## 2016-05-09 DIAGNOSIS — R4701 Aphasia: Secondary | ICD-10-CM | POA: Diagnosis not present

## 2016-05-09 DIAGNOSIS — I4891 Unspecified atrial fibrillation: Secondary | ICD-10-CM | POA: Diagnosis not present

## 2016-05-09 DIAGNOSIS — E1142 Type 2 diabetes mellitus with diabetic polyneuropathy: Secondary | ICD-10-CM | POA: Diagnosis not present

## 2016-05-09 DIAGNOSIS — R531 Weakness: Secondary | ICD-10-CM | POA: Diagnosis not present

## 2016-05-09 DIAGNOSIS — I10 Essential (primary) hypertension: Secondary | ICD-10-CM | POA: Diagnosis not present

## 2016-05-13 DIAGNOSIS — R4701 Aphasia: Secondary | ICD-10-CM | POA: Diagnosis not present

## 2016-05-13 DIAGNOSIS — E1142 Type 2 diabetes mellitus with diabetic polyneuropathy: Secondary | ICD-10-CM | POA: Diagnosis not present

## 2016-05-13 DIAGNOSIS — E785 Hyperlipidemia, unspecified: Secondary | ICD-10-CM | POA: Diagnosis not present

## 2016-05-13 DIAGNOSIS — R531 Weakness: Secondary | ICD-10-CM | POA: Diagnosis not present

## 2016-05-13 DIAGNOSIS — I10 Essential (primary) hypertension: Secondary | ICD-10-CM | POA: Diagnosis not present

## 2016-05-13 DIAGNOSIS — I4891 Unspecified atrial fibrillation: Secondary | ICD-10-CM | POA: Diagnosis not present

## 2016-05-14 DIAGNOSIS — E1142 Type 2 diabetes mellitus with diabetic polyneuropathy: Secondary | ICD-10-CM | POA: Diagnosis not present

## 2016-05-14 DIAGNOSIS — E785 Hyperlipidemia, unspecified: Secondary | ICD-10-CM | POA: Diagnosis not present

## 2016-05-14 DIAGNOSIS — R531 Weakness: Secondary | ICD-10-CM | POA: Diagnosis not present

## 2016-05-14 DIAGNOSIS — I10 Essential (primary) hypertension: Secondary | ICD-10-CM | POA: Diagnosis not present

## 2016-05-14 DIAGNOSIS — R4701 Aphasia: Secondary | ICD-10-CM | POA: Diagnosis not present

## 2016-05-14 DIAGNOSIS — I4891 Unspecified atrial fibrillation: Secondary | ICD-10-CM | POA: Diagnosis not present

## 2016-05-16 DIAGNOSIS — I4891 Unspecified atrial fibrillation: Secondary | ICD-10-CM | POA: Diagnosis not present

## 2016-05-16 DIAGNOSIS — R4701 Aphasia: Secondary | ICD-10-CM | POA: Diagnosis not present

## 2016-05-16 DIAGNOSIS — E785 Hyperlipidemia, unspecified: Secondary | ICD-10-CM | POA: Diagnosis not present

## 2016-05-16 DIAGNOSIS — I10 Essential (primary) hypertension: Secondary | ICD-10-CM | POA: Diagnosis not present

## 2016-05-16 DIAGNOSIS — E1142 Type 2 diabetes mellitus with diabetic polyneuropathy: Secondary | ICD-10-CM | POA: Diagnosis not present

## 2016-05-16 DIAGNOSIS — R531 Weakness: Secondary | ICD-10-CM | POA: Diagnosis not present

## 2016-05-21 DIAGNOSIS — E1121 Type 2 diabetes mellitus with diabetic nephropathy: Secondary | ICD-10-CM | POA: Diagnosis not present

## 2016-05-21 DIAGNOSIS — I480 Paroxysmal atrial fibrillation: Secondary | ICD-10-CM | POA: Diagnosis not present

## 2016-05-21 DIAGNOSIS — E785 Hyperlipidemia, unspecified: Secondary | ICD-10-CM | POA: Diagnosis not present

## 2016-05-21 DIAGNOSIS — N183 Chronic kidney disease, stage 3 (moderate): Secondary | ICD-10-CM | POA: Diagnosis not present

## 2016-05-21 DIAGNOSIS — I4891 Unspecified atrial fibrillation: Secondary | ICD-10-CM | POA: Diagnosis not present

## 2016-05-21 DIAGNOSIS — E7800 Pure hypercholesterolemia, unspecified: Secondary | ICD-10-CM | POA: Diagnosis not present

## 2016-05-21 DIAGNOSIS — I129 Hypertensive chronic kidney disease with stage 1 through stage 4 chronic kidney disease, or unspecified chronic kidney disease: Secondary | ICD-10-CM | POA: Diagnosis not present

## 2016-05-21 DIAGNOSIS — Z79899 Other long term (current) drug therapy: Secondary | ICD-10-CM | POA: Diagnosis not present

## 2016-05-21 DIAGNOSIS — R4701 Aphasia: Secondary | ICD-10-CM | POA: Diagnosis not present

## 2016-05-21 DIAGNOSIS — I10 Essential (primary) hypertension: Secondary | ICD-10-CM | POA: Diagnosis not present

## 2016-05-21 DIAGNOSIS — R531 Weakness: Secondary | ICD-10-CM | POA: Diagnosis not present

## 2016-05-21 DIAGNOSIS — E1142 Type 2 diabetes mellitus with diabetic polyneuropathy: Secondary | ICD-10-CM | POA: Diagnosis not present

## 2016-05-21 DIAGNOSIS — Z23 Encounter for immunization: Secondary | ICD-10-CM | POA: Diagnosis not present

## 2016-05-22 DIAGNOSIS — I10 Essential (primary) hypertension: Secondary | ICD-10-CM | POA: Diagnosis not present

## 2016-05-22 DIAGNOSIS — E785 Hyperlipidemia, unspecified: Secondary | ICD-10-CM | POA: Diagnosis not present

## 2016-05-22 DIAGNOSIS — I4891 Unspecified atrial fibrillation: Secondary | ICD-10-CM | POA: Diagnosis not present

## 2016-05-22 DIAGNOSIS — E1142 Type 2 diabetes mellitus with diabetic polyneuropathy: Secondary | ICD-10-CM | POA: Diagnosis not present

## 2016-05-22 DIAGNOSIS — R531 Weakness: Secondary | ICD-10-CM | POA: Diagnosis not present

## 2016-05-22 DIAGNOSIS — R4701 Aphasia: Secondary | ICD-10-CM | POA: Diagnosis not present

## 2016-05-23 DIAGNOSIS — I4891 Unspecified atrial fibrillation: Secondary | ICD-10-CM | POA: Diagnosis not present

## 2016-05-23 DIAGNOSIS — I10 Essential (primary) hypertension: Secondary | ICD-10-CM | POA: Diagnosis not present

## 2016-05-23 DIAGNOSIS — E785 Hyperlipidemia, unspecified: Secondary | ICD-10-CM | POA: Diagnosis not present

## 2016-05-23 DIAGNOSIS — R4701 Aphasia: Secondary | ICD-10-CM | POA: Diagnosis not present

## 2016-05-23 DIAGNOSIS — R531 Weakness: Secondary | ICD-10-CM | POA: Diagnosis not present

## 2016-05-23 DIAGNOSIS — E1142 Type 2 diabetes mellitus with diabetic polyneuropathy: Secondary | ICD-10-CM | POA: Diagnosis not present

## 2016-05-24 DIAGNOSIS — I10 Essential (primary) hypertension: Secondary | ICD-10-CM | POA: Diagnosis not present

## 2016-05-24 DIAGNOSIS — E1142 Type 2 diabetes mellitus with diabetic polyneuropathy: Secondary | ICD-10-CM | POA: Diagnosis not present

## 2016-05-24 DIAGNOSIS — E785 Hyperlipidemia, unspecified: Secondary | ICD-10-CM | POA: Diagnosis not present

## 2016-05-24 DIAGNOSIS — R4701 Aphasia: Secondary | ICD-10-CM | POA: Diagnosis not present

## 2016-05-24 DIAGNOSIS — I4891 Unspecified atrial fibrillation: Secondary | ICD-10-CM | POA: Diagnosis not present

## 2016-05-24 DIAGNOSIS — R531 Weakness: Secondary | ICD-10-CM | POA: Diagnosis not present

## 2016-05-28 DIAGNOSIS — I10 Essential (primary) hypertension: Secondary | ICD-10-CM | POA: Diagnosis not present

## 2016-05-28 DIAGNOSIS — R4701 Aphasia: Secondary | ICD-10-CM | POA: Diagnosis not present

## 2016-05-28 DIAGNOSIS — E785 Hyperlipidemia, unspecified: Secondary | ICD-10-CM | POA: Diagnosis not present

## 2016-05-28 DIAGNOSIS — I4891 Unspecified atrial fibrillation: Secondary | ICD-10-CM | POA: Diagnosis not present

## 2016-05-28 DIAGNOSIS — E1142 Type 2 diabetes mellitus with diabetic polyneuropathy: Secondary | ICD-10-CM | POA: Diagnosis not present

## 2016-05-28 DIAGNOSIS — R531 Weakness: Secondary | ICD-10-CM | POA: Diagnosis not present

## 2016-05-30 DIAGNOSIS — R531 Weakness: Secondary | ICD-10-CM | POA: Diagnosis not present

## 2016-05-30 DIAGNOSIS — I4891 Unspecified atrial fibrillation: Secondary | ICD-10-CM | POA: Diagnosis not present

## 2016-05-30 DIAGNOSIS — E785 Hyperlipidemia, unspecified: Secondary | ICD-10-CM | POA: Diagnosis not present

## 2016-05-30 DIAGNOSIS — R4701 Aphasia: Secondary | ICD-10-CM | POA: Diagnosis not present

## 2016-05-30 DIAGNOSIS — I10 Essential (primary) hypertension: Secondary | ICD-10-CM | POA: Diagnosis not present

## 2016-05-30 DIAGNOSIS — E1142 Type 2 diabetes mellitus with diabetic polyneuropathy: Secondary | ICD-10-CM | POA: Diagnosis not present

## 2016-05-31 DIAGNOSIS — R4701 Aphasia: Secondary | ICD-10-CM | POA: Diagnosis not present

## 2016-05-31 DIAGNOSIS — R531 Weakness: Secondary | ICD-10-CM | POA: Diagnosis not present

## 2016-05-31 DIAGNOSIS — I4891 Unspecified atrial fibrillation: Secondary | ICD-10-CM | POA: Diagnosis not present

## 2016-05-31 DIAGNOSIS — E785 Hyperlipidemia, unspecified: Secondary | ICD-10-CM | POA: Diagnosis not present

## 2016-05-31 DIAGNOSIS — E1142 Type 2 diabetes mellitus with diabetic polyneuropathy: Secondary | ICD-10-CM | POA: Diagnosis not present

## 2016-05-31 DIAGNOSIS — I10 Essential (primary) hypertension: Secondary | ICD-10-CM | POA: Diagnosis not present

## 2016-06-04 DIAGNOSIS — R4701 Aphasia: Secondary | ICD-10-CM | POA: Diagnosis not present

## 2016-06-04 DIAGNOSIS — I4891 Unspecified atrial fibrillation: Secondary | ICD-10-CM | POA: Diagnosis not present

## 2016-06-04 DIAGNOSIS — E785 Hyperlipidemia, unspecified: Secondary | ICD-10-CM | POA: Diagnosis not present

## 2016-06-04 DIAGNOSIS — E1142 Type 2 diabetes mellitus with diabetic polyneuropathy: Secondary | ICD-10-CM | POA: Diagnosis not present

## 2016-06-04 DIAGNOSIS — I10 Essential (primary) hypertension: Secondary | ICD-10-CM | POA: Diagnosis not present

## 2016-06-04 DIAGNOSIS — R531 Weakness: Secondary | ICD-10-CM | POA: Diagnosis not present

## 2016-06-07 DIAGNOSIS — R4701 Aphasia: Secondary | ICD-10-CM | POA: Diagnosis not present

## 2016-06-07 DIAGNOSIS — I4891 Unspecified atrial fibrillation: Secondary | ICD-10-CM | POA: Diagnosis not present

## 2016-06-07 DIAGNOSIS — I10 Essential (primary) hypertension: Secondary | ICD-10-CM | POA: Diagnosis not present

## 2016-06-07 DIAGNOSIS — E785 Hyperlipidemia, unspecified: Secondary | ICD-10-CM | POA: Diagnosis not present

## 2016-06-07 DIAGNOSIS — R531 Weakness: Secondary | ICD-10-CM | POA: Diagnosis not present

## 2016-06-07 DIAGNOSIS — E1142 Type 2 diabetes mellitus with diabetic polyneuropathy: Secondary | ICD-10-CM | POA: Diagnosis not present

## 2016-07-05 ENCOUNTER — Encounter: Payer: Self-pay | Admitting: Neurology

## 2016-07-05 ENCOUNTER — Ambulatory Visit (INDEPENDENT_AMBULATORY_CARE_PROVIDER_SITE_OTHER): Payer: Medicare Other | Admitting: Neurology

## 2016-07-05 VITALS — BP 158/88 | HR 97 | Ht 74.0 in | Wt 176.4 lb

## 2016-07-05 DIAGNOSIS — I2510 Atherosclerotic heart disease of native coronary artery without angina pectoris: Secondary | ICD-10-CM

## 2016-07-05 DIAGNOSIS — I10 Essential (primary) hypertension: Secondary | ICD-10-CM

## 2016-07-05 DIAGNOSIS — G3184 Mild cognitive impairment, so stated: Secondary | ICD-10-CM

## 2016-07-05 DIAGNOSIS — E785 Hyperlipidemia, unspecified: Secondary | ICD-10-CM | POA: Diagnosis not present

## 2016-07-05 NOTE — Progress Notes (Signed)
NEUROLOGY CONSULTATION NOTE  Andrew Weaver MRN: [id number] DOB: 12-09-1918  Referring provider: Dr. Lajean Manes Primary care provider: Dr. Lajean Manes  Reason for consult:  Memory loss  Dear Dr Felipa Eth:  Thank you for your kind referral of Andrew Weaver for consultation of the above symptoms. Although his history is well known to you, please allow me to reiterate it for the purpose of our medical record. Records and images were personally reviewed where available.  HISTORY OF PRESENT ILLNESS: This is a pleasant 82 year old left-handed man with a history of hypertension, hyperlipidemia, CAD, atrial fibrillation, presenting after a hospital admission for aphasia in August 2017. At that time he was noted to possibly have underlying dementia and was referred for outpatient evaluation. He feels his memory is "bad, mostly short-term." He lives with his wife. Their granddaughter has been helping with bill payments for the past 6 months after he missed a payment. He denies missing medications. He continues to drive and denies getting lost driving. He is a retired Chief Financial Officer and has noticed that he has lost the ability to write in cursive and instead writes in print all the time. He does not keep good records. He states he was in the ER after his blood glucose went down below 50 and he fell. He denies any speech difficulties, which was also noted on ER visit, where he was unclear why he was in the ER, going on tangent about his blood sugar. On review of ER notes, he had intermittent speech difficulties for 24-48 hours prior to admission. There is a note that he had a UTI but was not yet started on antibiotic. Exam was non-focal, head CT was normal. Carotid dopplers did not show any significant stenosis. Echo done in January was overall normal. It was felt that the speech difficulties were due to UTI (however urinalysis was negative and antibiotics were not started) versus underlying mild dementia  instead of TIA. He reports being independent with ADLs. They have a caregiver in the house for his wife.   He denies any headaches, dizziness, diplopia, dysarthria/dysphagia, neck/back pain, focal numbness/tingling/weakness. He feels unstable when walking, and reports two falls from low blood sugar. He has alternating constipation and diarrhea. There is no family history of dementia. No history of significant TBI or alcohol intake.   Laboratory Data: Lab Results  Component Value Date   WBC 5.2 04/28/2016   HGB 12.9 (L) 04/28/2016   HCT 39.5 04/28/2016   MCV 101.3 (H) 04/28/2016   PLT 178 04/28/2016     Chemistry      Component Value Date/Time   NA 136 04/29/2016 0543   K 4.2 04/29/2016 0543   CL 104 04/29/2016 0543   CO2 26 04/29/2016 0543   BUN 14 04/29/2016 0543   CREATININE 1.30 (H) 04/29/2016 0543      Component Value Date/Time   CALCIUM 8.6 (L) 04/29/2016 0543   ALKPHOS 89 04/27/2016 1445   AST 35 04/27/2016 1445   ALT 20 04/27/2016 1445   BILITOT 0.7 04/27/2016 1445     Lab Results  Component Value Date   TSH 3.048 04/27/2016   Lab Results  Component Value Date   VITAMINB12 193 04/28/2016    PAST MEDICAL HISTORY: Past Medical History:  Diagnosis Date  . BPH (benign prostatic hypertrophy)   . CAD (coronary artery disease)   . Chronic kidney disease    borderline stage II/stage III  . Diabetes mellitus   . Hyperlipidemia  goal LDL less than 70  . Hypertension   . Lock jaw   . PVD (peripheral vascular disease) ([place])     PAST SURGICAL HISTORY: Past Surgical History:  Procedure Laterality Date  . CATARACT EXTRACTION Bilateral   . CORONARY ANGIOPLASTY     with stent  . PATELLA FRACTURE SURGERY Left     MEDICATIONS: Current Outpatient Prescriptions on File Prior to Visit  Medication Sig Dispense Refill  . atorvastatin (LIPITOR) 10 MG tablet Take 10 mg by mouth every evening. 4pm    . clopidogrel (PLAVIX) 75 MG tablet Take 75 mg by mouth daily.       . insulin detemir (LEVEMIR) 100 UNIT/ML injection Inject 20-25 Units into the skin daily with breakfast.     . insulin regular (NOVOLIN R,HUMULIN R) 100 units/mL injection Inject 5-10 Units into the skin 3 (three) times daily before meals.     Marland Kitchen losartan (COZAAR) 100 MG tablet Take 100 mg by mouth every evening. 4pm    . nitroGLYCERIN (NITROSTAT) 0.4 MG SL tablet Place 0.4 mg under the tongue every 5 (five) minutes as needed for chest pain (3 DOSES MAX).    . ONE TOUCH ULTRA TEST test strip TEST TID UTD  2  . polyethylene glycol powder (GLYCOLAX/MIRALAX) powder Take 17 g by mouth daily as needed (constipation). CONSTIPATION     No current facility-administered medications on file prior to visit.     ALLERGIES: Allergies  Allergen Reactions  . Lipitor [Atorvastatin] Other (See Comments)    Fatigue - pt tolerates 10 mg dose    FAMILY HISTORY: Family History  Problem Relation Age of Onset  . Heart disease Mother   . Heart attack Mother   . Heart disease Father   . Diabetes Father   . Heart attack Father     SOCIAL HISTORY: Social History   Social History  . Marital status: Married    Spouse name: N/A  . Number of children: 1  . Years of education: N/A   Occupational History  . retired    Social History Main Topics  . Smoking status: Former Smoker    Types: Cigarettes    Quit date: 09/17/1991  . Smokeless tobacco: Never Used  . Alcohol use 0.0 oz/week     Comment: social  . Drug use: No  . Sexual activity: Not on file   Other Topics Concern  . Not on file   Social History Narrative  . No narrative on file    REVIEW OF SYSTEMS: Constitutional: No fevers, chills, or sweats, no generalized fatigue, change in appetite Eyes: No visual changes, double vision, eye pain Ear, nose and throat: No hearing loss, ear pain, nasal congestion, sore throat Cardiovascular: No chest pain, palpitations Respiratory:  No shortness of breath at rest or with exertion,  wheezes GastrointestinaI: No nausea, vomiting, diarrhea, abdominal pain, fecal incontinence Genitourinary:  No dysuria, urinary retention or frequency Musculoskeletal:  No neck pain, back pain Integumentary: No rash, pruritus, skin lesions Neurological: as above Psychiatric: No depression, insomnia, anxiety Endocrine: No palpitations, fatigue, diaphoresis, mood swings, change in appetite, change in weight, increased thirst Hematologic/Lymphatic:  No anemia, purpura, petechiae. Allergic/Immunologic: no itchy/runny eyes, nasal congestion, recent allergic reactions, rashes  PHYSICAL EXAM: Vitals:   07/05/16 1025  BP: (!) 158/88  Pulse: 97   General: No acute distress Head:  Normocephalic/atraumatic Eyes: Fundoscopic exam shows bilateral sharp discs, no vessel changes, exudates, or hemorrhages Neck: supple, no paraspinal tenderness, full range of motion Back:  No paraspinal tenderness Heart: regular rate and rhythm Lungs: Clear to auscultation bilaterally. Vascular: No carotid bruits. Skin/Extremities: No rash, no edema Neurological Exam: Mental status: alert and oriented to person, place, and time, no dysarthria or aphasia, Fund of knowledge is appropriate.  Recent and remote memory are intact.  Attention and concentration are normal.    Able to name objects and repeat phrases. CDT 5/5  MMSE - Mini Mental State Exam 07/05/2016  Orientation to time 5  Orientation to Place 5  Registration 3  Attention/ Calculation 1  Recall 2  Language- name 2 objects 2  Language- repeat 1  Language- follow 3 step command 2  Language- read & follow direction 1  Write a sentence 1  Copy design 1  Total score 24   Cranial nerves: CN I: not tested CN II: pupils equal, round and reactive to light, visual fields intact, fundi unremarkable. CN III, IV, VI:  full range of motion, no nystagmus, no ptosis CN V: facial sensation intact CN VII: upper and lower face symmetric CN VIII: hearing intact to  finger rub CN IX, X: gag intact, uvula midline CN XI: sternocleidomastoid and trapezius muscles intact CN XII: tongue midline Bulk & Tone: normal, no fasciculations. Motor: 5/5 throughout with no pronator drift. Sensation: intact to light touch, cold, pin, vibration and joint position sense.  No extinction to double simultaneous stimulation.  Romberg test negative Deep Tendon Reflexes: +2 throughout, no ankle clonus Plantar responses: downgoing bilaterally Cerebellar: no incoordination on finger to nose, heel to shin. No dysdiadochokinesia Gait: slow and cautious, no ataxia Tremor: none  IMPRESSION: This is a pleasant 82 year old left-handed man with a history of hypertension, hyperlipidemia, CAD, atrial fibrillation, presenting after a hospital admission for aphasia in August 2017. The patient denies having any episodes of speech difficulties, and states he was there for a fall due to low blood sugar. His neurological exam today is normal, MMSE 24/30, indicating mild cognitive impairment. MRI brain in January 2017 showed moderate chronic microvascular disease and atrophy. Head CT when he presented with speech difficulties did not show any acute changes. We discussed results of today's visit, and option of starting cholinesterase inhibitors such as Aricept, including side effects and expectations from the medication. He would like to hold off for now. We discussed driving, continue to monitor. We discussed the importance of control of vascular risk factors, physical exercise, and brain stimulation exercises for brain health. He will follow-up in 6 months and knows to call for any changes.   Thank you for allowing me to participate in the care of this patient. Please do not hesitate to call for any questions or concerns.   Ellouise Newer, M.D.  CC: Dr. Felipa Eth

## 2016-07-05 NOTE — Patient Instructions (Signed)
1. Continue to monitor memory, follow-up in 6 months 2. Continue to monitor driving, let the DMV know of your memory problems

## 2016-07-09 DIAGNOSIS — G3184 Mild cognitive impairment, so stated: Secondary | ICD-10-CM | POA: Insufficient documentation

## 2016-07-31 DIAGNOSIS — E119 Type 2 diabetes mellitus without complications: Secondary | ICD-10-CM | POA: Diagnosis not present

## 2016-07-31 DIAGNOSIS — Z961 Presence of intraocular lens: Secondary | ICD-10-CM | POA: Diagnosis not present

## 2016-11-07 ENCOUNTER — Telehealth: Payer: Self-pay | Admitting: Interventional Cardiology

## 2016-11-07 DIAGNOSIS — W19XXXD Unspecified fall, subsequent encounter: Secondary | ICD-10-CM

## 2016-11-07 DIAGNOSIS — R55 Syncope and collapse: Secondary | ICD-10-CM

## 2016-11-07 NOTE — Telephone Encounter (Signed)
New message    Pt granddaughter is calling to schedule appt with Dr. Irish Lack. She states last time he was in the hospital, the cardiologist there suggested a stress test. She ask if he could have one done on the same day that he has an appt scheduled? I informed her the doctor would have to order one.

## 2016-11-07 NOTE — Telephone Encounter (Signed)
**Note De-identified  Obfuscation** Please advise 

## 2016-11-11 NOTE — Telephone Encounter (Signed)
It was an echocardiogram that was recommended, not a stress test.  How is the patient doing?  Has he had further symptoms since leaving the hospital?

## 2016-11-12 NOTE — Telephone Encounter (Signed)
Spoke with patient's granddaughter (DPR on file) and she states that the patient had one episode about two weeks ago where the patient was unable to put his words together, was unsteady and confused. She states that the episode lasted for about an hour and then the patient returned to baseline and was able to answer all orientation questions and did not have any weaknesses or upper extremity drifts. She states that the patient did not have any LOC, chest pain, or SOB. The granddaughter would like to know if the echocardiogram should be scheduled on the same day as his appointment on 12/18/16. Please advise. Thanks.

## 2016-11-13 DIAGNOSIS — L570 Actinic keratosis: Secondary | ICD-10-CM | POA: Diagnosis not present

## 2016-11-13 DIAGNOSIS — L820 Inflamed seborrheic keratosis: Secondary | ICD-10-CM | POA: Diagnosis not present

## 2016-11-13 DIAGNOSIS — X32XXXD Exposure to sunlight, subsequent encounter: Secondary | ICD-10-CM | POA: Diagnosis not present

## 2016-11-13 DIAGNOSIS — D034 Melanoma in situ of scalp and neck: Secondary | ICD-10-CM | POA: Diagnosis not present

## 2016-11-13 NOTE — Telephone Encounter (Signed)
Spoke with granddaughter and scheduled echocardiogram for 12/18/16 at 8:30 AM to be completed on the same day as patient's appointment.

## 2016-11-22 DIAGNOSIS — L820 Inflamed seborrheic keratosis: Secondary | ICD-10-CM | POA: Diagnosis not present

## 2016-11-22 DIAGNOSIS — D034 Melanoma in situ of scalp and neck: Secondary | ICD-10-CM | POA: Diagnosis not present

## 2016-11-22 DIAGNOSIS — D225 Melanocytic nevi of trunk: Secondary | ICD-10-CM | POA: Diagnosis not present

## 2016-11-26 DIAGNOSIS — E1142 Type 2 diabetes mellitus with diabetic polyneuropathy: Secondary | ICD-10-CM | POA: Diagnosis not present

## 2016-11-26 DIAGNOSIS — I480 Paroxysmal atrial fibrillation: Secondary | ICD-10-CM | POA: Diagnosis not present

## 2016-11-26 DIAGNOSIS — E7800 Pure hypercholesterolemia, unspecified: Secondary | ICD-10-CM | POA: Diagnosis not present

## 2016-11-26 DIAGNOSIS — Z0000 Encounter for general adult medical examination without abnormal findings: Secondary | ICD-10-CM | POA: Diagnosis not present

## 2016-11-26 DIAGNOSIS — I129 Hypertensive chronic kidney disease with stage 1 through stage 4 chronic kidney disease, or unspecified chronic kidney disease: Secondary | ICD-10-CM | POA: Diagnosis not present

## 2016-11-26 DIAGNOSIS — E1121 Type 2 diabetes mellitus with diabetic nephropathy: Secondary | ICD-10-CM | POA: Diagnosis not present

## 2016-11-26 DIAGNOSIS — Z794 Long term (current) use of insulin: Secondary | ICD-10-CM | POA: Diagnosis not present

## 2016-11-26 DIAGNOSIS — Z1389 Encounter for screening for other disorder: Secondary | ICD-10-CM | POA: Diagnosis not present

## 2016-11-26 DIAGNOSIS — N183 Chronic kidney disease, stage 3 (moderate): Secondary | ICD-10-CM | POA: Diagnosis not present

## 2016-11-26 DIAGNOSIS — Z79899 Other long term (current) drug therapy: Secondary | ICD-10-CM | POA: Diagnosis not present

## 2016-12-17 NOTE — Progress Notes (Signed)
Patient ID: Andrew Weaver, male   DOB: [birth year], 83 y.o.   MRN: [id number]    [facility], [address] [place], [place]  [address] Phone: [phone] Fax:  [phone]  Date:  12/18/2016   ID:  Andrew Weaver, DOB [birth year], MRN [id number]  PCP:  Mathews Argyle, MD      History of Present Illness: Andrew Weaver is a 83 y.o. male who has 2 stents in the LAD in 2007 ( PTCA in 2000), out of town in [place]. 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  Admitted in Jan 2017 due to fall with head trauma and periorbital hematoma.  He feels that his balance is still not good.  He does not use a cane well.  He has not been walking much. He feels fatigued generally. IN early 2013, he had a syncopal episode and had a cut over his left eye requiring stitches.   Several years ago, he had an episode of slurred speech and was started on aspirin. No further syncope or dizziness.  He has remained on clopidogrel for his coronary stents.   CAD/ASCVD:   Denies : Chest pain. Diaphoresis. Dizziness. Dyspnea on exertion. Leg edema. Nitroglycerin. Palpitations.  Paroxysmal nocturnal dyspnea.  Syncope none recently.   BP readings in he 681-275 systolic range when he was checking. He has not been checking recently.  Bruises with aspirin and clopidogrel combination.  Weight stable.  His wife cannot walk.  He gets exercise by having to keep his apartment clean, since she is not able to help.  His A1C was high so Lantus was increased by Dr. Felipa Eth in the past.    He has had issues with low blood sugar.  He has had to eat a cookie at times to get his blood sugar increased. The granddaughter is with him today. She reports episodes where his alertness decreases acutely. This is what prompted his last hospitalization. They are brief.  He may have trouble speaking or reading.   She was concerned that potentially atrial fibrillation could be causing this. We did discuss  the possibility of anticoagulation and both the patient and granddaughter are in agreement that he would not want anticoagulation due to his falls risk and existing sense that he bruises easily.  Wt Readings from Last 3 Encounters:  12/18/16 179 lb (81.2 kg)  07/05/16 176 lb 6 oz (80 kg)  04/29/16 172 lb 9.6 oz (78.3 kg)     Past Medical History:  Diagnosis Date  . BPH (benign prostatic hypertrophy)   . CAD (coronary artery disease)   . Chronic kidney disease    borderline stage II/stage III  . Diabetes mellitus   . Hyperlipidemia    goal LDL less than 70  . Hypertension   . Lock jaw   . PVD (peripheral vascular disease) ([place])     Current Outpatient Prescriptions  Medication Sig Dispense Refill  . atorvastatin (LIPITOR) 10 MG tablet Take 10 mg by mouth every evening. 4pm    . clopidogrel (PLAVIX) 75 MG tablet Take 75 mg by mouth daily.     . insulin detemir (LEVEMIR) 100 UNIT/ML injection Inject 20-25 Units into the skin daily with breakfast.     . insulin regular (NOVOLIN R,HUMULIN R) 100 units/mL injection Inject 5-10 Units into the skin 3 (three) times daily before meals.     Marland Kitchen losartan (COZAAR) 100 MG tablet Take 100 mg by mouth every evening. 4pm    .  nitroGLYCERIN (NITROSTAT) 0.4 MG SL tablet Place 1 tablet (0.4 mg total) under the tongue every 5 (five) minutes as needed for chest pain (3 DOSES MAX). 30 tablet 1  . polyethylene glycol powder (GLYCOLAX/MIRALAX) powder Take 17 g by mouth daily as needed (constipation). CONSTIPATION    . ONE TOUCH ULTRA TEST test strip TEST TID UTD  2   No current facility-administered medications for this visit.     Allergies:    Allergies  Allergen Reactions  . Lipitor [Atorvastatin] Other (See Comments)    Fatigue - pt tolerates 10 mg dose    Social History:  The patient  reports that he quit smoking about 25 years ago. His smoking use included Cigarettes. He has never used smokeless tobacco. He reports that he drinks alcohol. He  reports that he does not use drugs.   Family History:  The patient's family history includes Diabetes in his father; Heart attack in his father and mother; Heart disease in his father and mother.   ROS:  Please see the history of present illness.  No nausea, vomiting.  No fevers, chills.  No focal weakness.  No dysuria.    All other systems reviewed and negative.   PHYSICAL EXAM: VS:  BP (!) 148/80   Pulse 65   Ht 6\' 2"  (1.88 m)   Wt 179 lb (81.2 kg)   BMI 22.98 kg/m  Well nourished, well developed, in no acute distress  HEENT: normal  Neck: no JVD , no carotid bruits Cardiac:  normal S1, S2; RRR;   Lungs:  clear to auscultation bilaterally, no wheezing, rhonchi or rales  Abd: soft, nontender, no hepatomegaly  Ext: no edema  Skin: warm and dry  Neuro:   no focal abnormalities noted, slow gait Psych: normal afect  ECG: NSR, PACs, no acute ST segment changes    ASSESSMENT AND PLAN:  CAD  Continue Clopidogrel Bisulfate Tablet, 75 MG, 1 tablet, Orally, Once a day, 30 day(s), 30, Refills 11 Notes: No angina.    No bleeding problems except for easy bruising.  No blood in stool.  No need for any further ischemic workup. I don't think the spells where his alertness is decreased is related to coronary artery disease. There was one ECG from the emergency room that I reviewed that was read as atrial fibrillation.  We discussed further monitoring as an outpatient with a life watch monitor. They are not interested in anticoagulation due to the bleeding risks. Therefore, he did not wish to pursue any further monitoring.    2. Hypercholesterolemia  Continue atorvastatin Tablet, 10 MG, 1 tablet, Orally, Once a day Followed by Dr. Felipa Eth. The granddaughter asked about niacin as an alternative. The patient has been taking statins for many years. I don't think that is the cause of his memory issues at this time.    3. Hypertension / Diabetes Continue Losartan Controlled. He will check blood  pressure readings at home.  Watch for any low blood pressure readings during the episodes described above.  Continue DM management as well        4. TIA  Occurred several years ago.  Possible recurrence recently.  Carotid Doppler in 8/17 was reviewed. No significant carotid disease. No bleeding problems since adding aspirin back except bruising.   No further w/u for AFib.  No need for echo at this time.  He is not in CHF.  He is not interested in more tests as it will not change management.  Preventive Medicine  Adult topics discussed:  Diet: healthy diet, low calorie, low fat.  Exercise: 5 days a week, at least 30 minutes of aerobic exercise, preferably on the exercise bike.     Signed, Mina Marble, MD, [facility] 12/18/2016 11:24 AM

## 2016-12-18 ENCOUNTER — Encounter (INDEPENDENT_AMBULATORY_CARE_PROVIDER_SITE_OTHER): Payer: Self-pay

## 2016-12-18 ENCOUNTER — Other Ambulatory Visit (HOSPITAL_COMMUNITY): Payer: Medicare Other

## 2016-12-18 ENCOUNTER — Encounter: Payer: Self-pay | Admitting: Interventional Cardiology

## 2016-12-18 ENCOUNTER — Ambulatory Visit (INDEPENDENT_AMBULATORY_CARE_PROVIDER_SITE_OTHER): Payer: Medicare Other | Admitting: Interventional Cardiology

## 2016-12-18 VITALS — BP 148/80 | HR 65 | Ht 74.0 in | Wt 179.0 lb

## 2016-12-18 DIAGNOSIS — E782 Mixed hyperlipidemia: Secondary | ICD-10-CM

## 2016-12-18 DIAGNOSIS — I499 Cardiac arrhythmia, unspecified: Secondary | ICD-10-CM

## 2016-12-18 DIAGNOSIS — I2510 Atherosclerotic heart disease of native coronary artery without angina pectoris: Secondary | ICD-10-CM

## 2016-12-18 DIAGNOSIS — I10 Essential (primary) hypertension: Secondary | ICD-10-CM

## 2016-12-18 MED ORDER — NITROGLYCERIN 0.4 MG SL SUBL: 0.4000 mg | SUBLINGUAL_TABLET | SUBLINGUAL | 1 refills | Status: AC | PRN

## 2016-12-18 NOTE — Patient Instructions (Signed)

## 2016-12-31 ENCOUNTER — Encounter: Payer: Self-pay | Admitting: Physician Assistant

## 2016-12-31 ENCOUNTER — Ambulatory Visit (INDEPENDENT_AMBULATORY_CARE_PROVIDER_SITE_OTHER): Payer: Medicare Other | Admitting: Physician Assistant

## 2016-12-31 VITALS — BP 160/80 | HR 70 | Temp 97.7°F | Resp 16 | Ht 74.0 in | Wt 176.6 lb

## 2016-12-31 DIAGNOSIS — S61412A Laceration without foreign body of left hand, initial encounter: Secondary | ICD-10-CM | POA: Diagnosis not present

## 2016-12-31 DIAGNOSIS — S40812A Abrasion of left upper arm, initial encounter: Secondary | ICD-10-CM | POA: Diagnosis not present

## 2016-12-31 DIAGNOSIS — I2510 Atherosclerotic heart disease of native coronary artery without angina pectoris: Secondary | ICD-10-CM

## 2016-12-31 NOTE — Progress Notes (Signed)
12/31/2016 11:57 AM   DOB: 02-14-1919 / MRN: [id number]  SUBJECTIVE:  Andrew Weaver is a 83 y.o. male presenting for lacerations to the left hand. Says that he fell yesterday around 4 pm while getting up out of his chair and the chair slipped.  Says "I did the devil's dance" in falling over and cut his hand in the process.  Has a large scrape to the left forearm as well. He is taking clopidogrel.  Denies any pain at this time, weakness, numbness.   He is allergic to lipitor [atorvastatin].   He  has a past medical history of BPH (benign prostatic hypertrophy); CAD (coronary artery disease); Chronic kidney disease; Diabetes mellitus; Hyperlipidemia; Hypertension; Lock jaw; and PVD (peripheral vascular disease) ([place]).    He  reports that he quit smoking about 25 years ago. His smoking use included Cigarettes. He has never used smokeless tobacco. He reports that he drinks alcohol. He reports that he does not use drugs. He  has no sexual activity history on file. The patient  has a past surgical history that includes Cataract extraction (Bilateral); Coronary angioplasty; and Patella fracture surgery (Left).  His family history includes Diabetes in his father; Heart attack in his father and mother; Heart disease in his father and mother.  Review of Systems  Constitutional: Negative for chills, diaphoresis and fever.  Eyes: Negative.   Gastrointestinal: Negative for nausea.  Skin: Negative for rash.  Neurological: Negative for dizziness, sensory change, speech change, focal weakness and headaches.    The problem list and medications were reviewed and updated by myself where necessary and exist elsewhere in the encounter.   OBJECTIVE:  BP (!) 160/80 (BP Location: Right Arm, Patient Position: Sitting, Cuff Size: Normal)   Pulse 70   Temp 97.7 F (36.5 C)   Resp 16   Ht 6\' 2"  (1.88 m)   Wt 176 lb 9.6 oz (80.1 kg)   SpO2 95%   BMI 22.67 kg/m   Physical Exam  Constitutional: He is  oriented to person, place, and time. He appears well-developed. He is active and cooperative.  Non-toxic appearance.  Eyes: EOM are normal. Pupils are equal, round, and reactive to light.  Cardiovascular: Normal rate.   Pulmonary/Chest: Effort normal. No tachypnea.  Musculoskeletal:       Arms:      Hands: Neurological: He is alert and oriented to person, place, and time. He has normal strength. He is not disoriented. No cranial nerve deficit or sensory deficit. He exhibits normal muscle tone. Coordination and gait normal.  Skin: Skin is warm and dry. He is not diaphoretic. No pallor.  Psychiatric: His behavior is normal.  Vitals reviewed.  Risk and benefits discussed and verbal consent obtained. Anesthetic allergies reviewed. Patient anesthetized using 1:1 mix of 2% lidocaine without epi and Marcaine. The wound was cleansed thoroughly with soap and water. Sterile prep and drape. Wounds closed tusing 5-0 Ethilon suture material. Hemostasis achieved. Mupirocin applied to the wound and bandage placed. The patient tolerated well. Wound instructions were provided and the patient is to return in 10 days for suture removal.   No results found for this or any previous visit (from the past 72 hour(s)).  No results found.  ASSESSMENT AND PLAN:  Emileo was seen today for laceration.  Diagnoses and all orders for this visit:  Laceration of left hand without foreign body, initial encounter: If any concerns RTC.  Comments: Both wounds repaired here.  Will see him back in 10 dyas  fo suture removal.   Arm abrasion, left, initial encounter: They will clean this daily and apply abx ointment.     The patient is advised to call or return to clinic if he does not see an improvement in symptoms, or to seek the care of the closest emergency department if he worsens with the above plan.   Philis Fendt, MHS, PA-C [facility] 12/31/2016 11:57 AM

## 2016-12-31 NOTE — Patient Instructions (Addendum)
For pain take 1000 mg of Tylenol every 8 hours as needed.   WOUND CARE Please return in 10 days to have your stitches/staples removed or sooner if you have concerns. Marland Kitchen Keep area clean and dry for 24 hours. Do not remove bandage, if applied. . After 24 hours, remove bandage and wash wound gently with mild soap and warm water. Reapply a new bandage after cleaning wound, if directed. . Continue daily cleansing with soap and water until stitches/staples are removed. . Do not apply any ointments or creams to the wound while stitches/staples are in place, as this may cause delayed healing. . Notify the office if you experience any of the following signs of infection: Swelling, redness, pus drainage, streaking, fever >101.0 F . Notify the office if you experience excessive bleeding that does not stop after 15-20 minutes of constant, firm pressure.      IF you received an x-ray today, you will receive an invoice from [facility]. Please contact [facility] at [phone] with questions or concerns regarding your invoice.   IF you received labwork today, you will receive an invoice from [facility]. Please contact [facility] at [phone] with questions or concerns regarding your invoice.   Our billing staff will not be able to assist you with questions regarding bills from these companies.  You will be contacted with the lab results as soon as they are available. The fastest way to get your results is to activate your My Chart account. Instructions are located on the last page of this paperwork. If you have not heard from [place] regarding the results in 2 weeks, please contact this office.

## 2017-01-02 DIAGNOSIS — H1133 Conjunctival hemorrhage, bilateral: Secondary | ICD-10-CM | POA: Diagnosis not present

## 2017-01-08 ENCOUNTER — Ambulatory Visit: Payer: Medicare Other | Admitting: Neurology

## 2017-01-08 DIAGNOSIS — Z029 Encounter for administrative examinations, unspecified: Secondary | ICD-10-CM

## 2017-01-10 ENCOUNTER — Encounter: Payer: Self-pay | Admitting: Neurology

## 2017-01-13 ENCOUNTER — Ambulatory Visit (INDEPENDENT_AMBULATORY_CARE_PROVIDER_SITE_OTHER): Payer: Medicare Other | Admitting: Physician Assistant

## 2017-01-13 ENCOUNTER — Encounter: Payer: Self-pay | Admitting: Physician Assistant

## 2017-01-13 VITALS — BP 146/70 | HR 77 | Temp 97.9°F | Resp 16 | Ht 74.0 in | Wt 176.0 lb

## 2017-01-13 DIAGNOSIS — Z4802 Encounter for removal of sutures: Secondary | ICD-10-CM

## 2017-01-13 NOTE — Patient Instructions (Signed)
     IF you received an x-ray today, you will receive an invoice from [facility]. Please contact [facility] at [phone] with questions or concerns regarding your invoice.   IF you received labwork today, you will receive an invoice from [facility]. Please contact [facility] at [phone] with questions or concerns regarding your invoice.   Our billing staff will not be able to assist you with questions regarding bills from these companies.  You will be contacted with the lab results as soon as they are available. The fastest way to get your results is to activate your My Chart account. Instructions are located on the last page of this paperwork. If you have not heard from us regarding the results in 2 weeks, please contact this office.     

## 2017-01-14 NOTE — Progress Notes (Signed)
  01/14/2017 8:44 AM   DOB: 03-12-1919 / MRN: [id number]  SUBJECTIVE:  Andrew Weaver is a 83 y.o. male presenting for suture removal.  He has had no problems.   He is allergic to lipitor [atorvastatin].   He  has a past medical history of BPH (benign prostatic hypertrophy); CAD (coronary artery disease); Chronic kidney disease; Diabetes mellitus; Hyperlipidemia; Hypertension; Lock jaw; and PVD (peripheral vascular disease) ([place]).    He  reports that he quit smoking about 25 years ago. His smoking use included Cigarettes. He has never used smokeless tobacco. He reports that he drinks alcohol. He reports that he does not use drugs. He  has no sexual activity history on file. The patient  has a past surgical history that includes Cataract extraction (Bilateral); Coronary angioplasty; and Patella fracture surgery (Left).  His family history includes Diabetes in his father; Heart attack in his father and mother; Heart disease in his father and mother.  ROS   Per HPI  The problem list and medications were reviewed and updated by myself where necessary and exist elsewhere in the encounter.   OBJECTIVE:  BP (!) 146/70   Pulse 77   Temp 97.9 F (36.6 C) (Oral)   Resp 16   Ht 6\' 2"  (1.88 m)   Wt 176 lb (79.8 kg)   SpO2 97%   BMI 22.60 kg/m   Physical Exam  Constitutional: He is active and cooperative.  Cardiovascular: Normal rate.   Pulmonary/Chest: Effort normal. No tachypnea.  Neurological: He is alert.  Skin: Skin is warm.  Vitals reviewed.  Procedure:  Sutures removed in entirety without difficulty.   No results found for this or any previous visit (from the past 72 hour(s)).  No results found.  ASSESSMENT AND PLAN:  Densel was seen today for follow-up.  Diagnoses and all orders for this visit:  Visit for suture removal    The patient is advised to call or return to clinic if he does not see an improvement in symptoms, or to seek the care of the closest emergency  department if he worsens with the above plan.   Philis Fendt, MHS, PA-C [facility] 01/14/2017 8:44 AM

## 2017-02-19 DIAGNOSIS — C4441 Basal cell carcinoma of skin of scalp and neck: Secondary | ICD-10-CM | POA: Diagnosis not present

## 2017-02-19 DIAGNOSIS — D225 Melanocytic nevi of trunk: Secondary | ICD-10-CM | POA: Diagnosis not present

## 2017-02-19 DIAGNOSIS — Z85820 Personal history of malignant melanoma of skin: Secondary | ICD-10-CM | POA: Diagnosis not present

## 2017-02-19 DIAGNOSIS — Z08 Encounter for follow-up examination after completed treatment for malignant neoplasm: Secondary | ICD-10-CM | POA: Diagnosis not present

## 2017-03-04 ENCOUNTER — Emergency Department (HOSPITAL_COMMUNITY)
Admission: EM | Admit: 2017-03-04 | Discharge: 2017-03-04 | Disposition: A | Discharge status: Home / Self Care | Payer: Medicare Other | Attending: Emergency Medicine | Admitting: Emergency Medicine

## 2017-03-04 ENCOUNTER — Encounter (HOSPITAL_COMMUNITY): Payer: Self-pay | Admitting: Emergency Medicine

## 2017-03-04 DIAGNOSIS — Z79899 Other long term (current) drug therapy: Secondary | ICD-10-CM | POA: Diagnosis not present

## 2017-03-04 DIAGNOSIS — L7622 Postprocedural hemorrhage and hematoma of skin and subcutaneous tissue following other procedure: Secondary | ICD-10-CM | POA: Insufficient documentation

## 2017-03-04 DIAGNOSIS — Z87891 Personal history of nicotine dependence: Secondary | ICD-10-CM | POA: Diagnosis not present

## 2017-03-04 DIAGNOSIS — Z7902 Long term (current) use of antithrombotics/antiplatelets: Secondary | ICD-10-CM | POA: Diagnosis not present

## 2017-03-04 DIAGNOSIS — T8189XA Other complications of procedures, not elsewhere classified, initial encounter: Secondary | ICD-10-CM | POA: Diagnosis not present

## 2017-03-04 DIAGNOSIS — T148XXA Other injury of unspecified body region, initial encounter: Secondary | ICD-10-CM

## 2017-03-04 DIAGNOSIS — N183 Chronic kidney disease, stage 3 (moderate): Secondary | ICD-10-CM | POA: Insufficient documentation

## 2017-03-04 DIAGNOSIS — D689 Coagulation defect, unspecified: Secondary | ICD-10-CM

## 2017-03-04 DIAGNOSIS — Z794 Long term (current) use of insulin: Secondary | ICD-10-CM | POA: Insufficient documentation

## 2017-03-04 DIAGNOSIS — E1142 Type 2 diabetes mellitus with diabetic polyneuropathy: Secondary | ICD-10-CM | POA: Diagnosis not present

## 2017-03-04 DIAGNOSIS — I129 Hypertensive chronic kidney disease with stage 1 through stage 4 chronic kidney disease, or unspecified chronic kidney disease: Secondary | ICD-10-CM | POA: Insufficient documentation

## 2017-03-04 DIAGNOSIS — R58 Hemorrhage, not elsewhere classified: Secondary | ICD-10-CM | POA: Diagnosis not present

## 2017-03-04 MED ORDER — "THROMBI-PAD 3""X3"" EX PADS"
1.0000 | MEDICATED_PAD | Freq: Once | CUTANEOUS | Status: AC
  Administered 2017-03-04: 1 via TOPICAL
  Filled 2017-03-04: qty 1

## 2017-03-04 NOTE — ED Notes (Signed)
Patient able to ambulate independently  

## 2017-03-04 NOTE — ED Provider Notes (Signed)
  Face-to-face evaluation   History: Patient had skin lesion removed, by curette, 2 weeks ago, and has been treating the wound, by cleansing and applying antibiotic ointment, daily.  Today the wound began bleeding, and he was unable to to stop it.  Presents by EMS for evaluation.  He takes Plavix.  He denies headache, weakness or chest pain.  Physical exam: Alert elderly man.  1.5 x 1 cm oval lesion, right mid neck, with granulation tissue and signs for recent bleeding, but no active bleeding at the time I saw him.  Thrombin pad treatment ordered, to help prevent further bleeding.  Medical screening examination/treatment/procedure(s) were conducted as a shared visit with non-physician practitioner(s) and myself.  I personally evaluated the patient during the encounter   Daleen Bo, MD 03/04/17 2328

## 2017-03-04 NOTE — ED Provider Notes (Signed)
[facility] DEPT Provider Note   CSN: [id number] Arrival date & time: 03/04/17  1950     History   Chief Complaint Chief Complaint  Patient presents with  . Post-op Problem    HPI Andrew Weaver is a 83 y.o. male.  Patient with recent pre-cancerous skin lesion excision from the right lateral neck x 1 week ago (Dr. Allyn Kenner). He reports the excision site started bleeding tonight while he was at rest. No trauma to the area. He was able to control the bleeding with pressure. He denies pain.   The history is provided by the patient. No language interpreter was used.    Past Medical History:  Diagnosis Date  . BPH (benign prostatic hypertrophy)   . CAD (coronary artery disease)   . Chronic kidney disease    borderline stage II/stage III  . Diabetes mellitus   . Hyperlipidemia    goal LDL less than 70  . Hypertension   . Lock jaw   . PVD (peripheral vascular disease) [facility])     Patient Active Problem List   Diagnosis Date Noted  . Mild cognitive impairment 07/09/2016  . Aphasia 04/27/2016  . A-fib ([place]) 04/27/2016  . Syncope 10/09/2015  . Fall 10/09/2015  . Type II or unspecified type diabetes mellitus with neurological manifestations, not stated as uncontrolled(250.60) 11/10/2013  . Diabetic polyneuropathy associated with type 2 diabetes mellitus ([place]) 11/10/2013  . Coronary atherosclerosis of native coronary artery 11/10/2013  . Mixed hyperlipidemia 11/10/2013  . Hypertension     Past Surgical History:  Procedure Laterality Date  . CATARACT EXTRACTION Bilateral   . CORONARY ANGIOPLASTY     with stent  . PATELLA FRACTURE SURGERY Left        Home Medications    Prior to Admission medications   Medication Sig Start Date End Date Taking? Authorizing Provider  atorvastatin (LIPITOR) 10 MG tablet Take 10 mg by mouth every evening. 4pm 04/22/16   [provider]  clopidogrel (PLAVIX) 75 MG tablet Take 75 mg by mouth daily.  08/23/13   [provider]  insulin detemir (LEVEMIR) 100 UNIT/ML injection Inject 20-25 Units into the skin daily with breakfast.     [provider]  insulin regular (NOVOLIN R,HUMULIN R) 100 units/mL injection Inject 5-10 Units into the skin 3 (three) times daily before meals.     [provider]  losartan (COZAAR) 100 MG tablet Take 100 mg by mouth every evening. 4pm    [provider]  nitroGLYCERIN (NITROSTAT) 0.4 MG SL tablet Place 1 tablet (0.4 mg total) under the tongue every 5 (five) minutes as needed for chest pain (3 DOSES MAX). 12/18/16   Jettie Booze, MD  ONE TOUCH ULTRA TEST test strip TEST TID UTD 03/14/16   [provider]  polyethylene glycol powder (GLYCOLAX/MIRALAX) powder Take 17 g by mouth daily as needed (constipation). CONSTIPATION 12/14/15   [provider]    Family History Family History  Problem Relation Age of Onset  . Heart disease Mother   . Heart attack Mother   . Heart disease Father   . Diabetes Father   . Heart attack Father     Social History Social History  Substance Use Topics  . Smoking status: Former Smoker    Types: Cigarettes    Quit date: 09/17/1991  . Smokeless tobacco: Never Used  . Alcohol use 0.0 oz/week     Comment: social     Allergies   Lipitor [atorvastatin]  Review of Systems Review of Systems  Constitutional: Negative for diaphoresis.  Musculoskeletal: Negative for myalgias.  Skin: Positive for wound.  Neurological: Negative for syncope, weakness, light-headedness and headaches.     Physical Exam Updated Vital Signs BP (!) 167/88 (BP Location: Right Arm)   Pulse (!) 56   Temp 98.1 F (36.7 C) (Oral)   Resp 16   SpO2 100%   Physical Exam  Constitutional: He is oriented to person, place, and time. He appears well-developed and well-nourished.  Neck: Normal range of motion.  Pulmonary/Chest: Effort normal.  Musculoskeletal: Normal range of motion.  Neurological: He is alert  and oriented to person, place, and time.  Skin: Skin is warm and dry.  Approximate 1 cm x 2 cm oval site of excision right lateral neck. There is no active bleeding. There is a small point of coagulated blood,presumed site of bleed.   Psychiatric: He has a normal mood and affect.     ED Treatments / Results  Labs (all labs ordered are listed, but only abnormal results are displayed) Labs Reviewed - No data to display  EKG  EKG Interpretation None       Radiology No results found.  Procedures Procedures (including critical care time)  Medications Ordered in ED Medications - No data to display   Initial Impression / Assessment and Plan / ED Course  I have reviewed the triage vital signs and the nursing notes.  Pertinent labs & imaging results that were available during my care of the patient were reviewed by me and considered in my medical decision making (see chart for details).     Patient presents with bleed at the site of a skin lesion removal one week ago. No active bleeding. Wound dressed with Thrombi Pad to prevent further bleeding. Recommend close follow up with Dr. Nevada Crane.   Final Clinical Impressions(s) / ED Diagnoses   Final diagnoses:  None   1. Post-op bleeding  New Prescriptions New Prescriptions   No medications on file     Dennie Bible 03/04/17 2041    Daleen Bo, MD 03/04/17 2328

## 2017-03-04 NOTE — ED Triage Notes (Signed)
Per EMS:  Pt had an excision of a tumor on his right neck 1 week ago, was watching TV tonight on the couch and surgical site began to bleed.  Pt states in a squirting motion.  4x4 on at this time, patient had held pressure and bleeding was controlled before EMS arrival.

## 2017-03-04 NOTE — ED Notes (Signed)
Called patient's caregiver who states they already have someone on the way to collect patient

## 2017-03-04 NOTE — Discharge Instructions (Signed)
Leave the bandage in place for 2 days unless there is bleeding, then change the bandage and reapply the Thrombi-Pad to clot the area. Return to the emergency department as needed. Follow up with Dr. Nevada Crane for recheck this week.

## 2017-03-05 DIAGNOSIS — Z85828 Personal history of other malignant neoplasm of skin: Secondary | ICD-10-CM | POA: Diagnosis not present

## 2017-03-05 DIAGNOSIS — Z08 Encounter for follow-up examination after completed treatment for malignant neoplasm: Secondary | ICD-10-CM | POA: Diagnosis not present

## 2017-04-01 DIAGNOSIS — Z08 Encounter for follow-up examination after completed treatment for malignant neoplasm: Secondary | ICD-10-CM | POA: Diagnosis not present

## 2017-04-01 DIAGNOSIS — Z85828 Personal history of other malignant neoplasm of skin: Secondary | ICD-10-CM | POA: Diagnosis not present

## 2017-04-04 DIAGNOSIS — E1142 Type 2 diabetes mellitus with diabetic polyneuropathy: Secondary | ICD-10-CM | POA: Diagnosis not present

## 2017-04-04 DIAGNOSIS — E1121 Type 2 diabetes mellitus with diabetic nephropathy: Secondary | ICD-10-CM | POA: Diagnosis not present

## 2017-04-04 DIAGNOSIS — I480 Paroxysmal atrial fibrillation: Secondary | ICD-10-CM | POA: Diagnosis not present

## 2017-04-04 DIAGNOSIS — I129 Hypertensive chronic kidney disease with stage 1 through stage 4 chronic kidney disease, or unspecified chronic kidney disease: Secondary | ICD-10-CM | POA: Diagnosis not present

## 2017-04-04 DIAGNOSIS — N183 Chronic kidney disease, stage 3 (moderate): Secondary | ICD-10-CM | POA: Diagnosis not present

## 2017-05-16 DIAGNOSIS — R2681 Unsteadiness on feet: Secondary | ICD-10-CM | POA: Diagnosis not present

## 2017-05-16 DIAGNOSIS — E1140 Type 2 diabetes mellitus with diabetic neuropathy, unspecified: Secondary | ICD-10-CM | POA: Diagnosis not present

## 2017-05-16 DIAGNOSIS — Z9181 History of falling: Secondary | ICD-10-CM | POA: Diagnosis not present

## 2017-05-20 DIAGNOSIS — E1140 Type 2 diabetes mellitus with diabetic neuropathy, unspecified: Secondary | ICD-10-CM | POA: Diagnosis not present

## 2017-05-20 DIAGNOSIS — R2681 Unsteadiness on feet: Secondary | ICD-10-CM | POA: Diagnosis not present

## 2017-05-20 DIAGNOSIS — Z9181 History of falling: Secondary | ICD-10-CM | POA: Diagnosis not present

## 2017-05-23 DIAGNOSIS — Z9181 History of falling: Secondary | ICD-10-CM | POA: Diagnosis not present

## 2017-05-23 DIAGNOSIS — R2681 Unsteadiness on feet: Secondary | ICD-10-CM | POA: Diagnosis not present

## 2017-05-23 DIAGNOSIS — E1140 Type 2 diabetes mellitus with diabetic neuropathy, unspecified: Secondary | ICD-10-CM | POA: Diagnosis not present

## 2017-05-26 DIAGNOSIS — E1140 Type 2 diabetes mellitus with diabetic neuropathy, unspecified: Secondary | ICD-10-CM | POA: Diagnosis not present

## 2017-05-26 DIAGNOSIS — Z9181 History of falling: Secondary | ICD-10-CM | POA: Diagnosis not present

## 2017-05-26 DIAGNOSIS — R2681 Unsteadiness on feet: Secondary | ICD-10-CM | POA: Diagnosis not present

## 2017-05-27 DIAGNOSIS — L570 Actinic keratosis: Secondary | ICD-10-CM | POA: Diagnosis not present

## 2017-05-27 DIAGNOSIS — Z08 Encounter for follow-up examination after completed treatment for malignant neoplasm: Secondary | ICD-10-CM | POA: Diagnosis not present

## 2017-05-27 DIAGNOSIS — Z85820 Personal history of malignant melanoma of skin: Secondary | ICD-10-CM | POA: Diagnosis not present

## 2017-05-27 DIAGNOSIS — X32XXXD Exposure to sunlight, subsequent encounter: Secondary | ICD-10-CM | POA: Diagnosis not present

## 2017-05-27 DIAGNOSIS — Z1283 Encounter for screening for malignant neoplasm of skin: Secondary | ICD-10-CM | POA: Diagnosis not present

## 2017-05-29 DIAGNOSIS — R2681 Unsteadiness on feet: Secondary | ICD-10-CM | POA: Diagnosis not present

## 2017-05-29 DIAGNOSIS — Z9181 History of falling: Secondary | ICD-10-CM | POA: Diagnosis not present

## 2017-05-29 DIAGNOSIS — E1140 Type 2 diabetes mellitus with diabetic neuropathy, unspecified: Secondary | ICD-10-CM | POA: Diagnosis not present

## 2017-06-03 ENCOUNTER — Ambulatory Visit
Admission: RE | Admit: 2017-06-03 | Discharge: 2017-06-03 | Disposition: A | Discharge status: Home / Self Care | Payer: Medicare Other | Source: Ambulatory Visit | Attending: Geriatric Medicine | Admitting: Geriatric Medicine

## 2017-06-03 ENCOUNTER — Other Ambulatory Visit: Payer: Self-pay | Admitting: Geriatric Medicine

## 2017-06-03 DIAGNOSIS — R062 Wheezing: Secondary | ICD-10-CM | POA: Diagnosis not present

## 2017-06-03 DIAGNOSIS — E1121 Type 2 diabetes mellitus with diabetic nephropathy: Secondary | ICD-10-CM | POA: Diagnosis not present

## 2017-06-03 DIAGNOSIS — E7800 Pure hypercholesterolemia, unspecified: Secondary | ICD-10-CM | POA: Diagnosis not present

## 2017-06-03 DIAGNOSIS — I480 Paroxysmal atrial fibrillation: Secondary | ICD-10-CM | POA: Diagnosis not present

## 2017-06-03 DIAGNOSIS — E1142 Type 2 diabetes mellitus with diabetic polyneuropathy: Secondary | ICD-10-CM | POA: Diagnosis not present

## 2017-06-03 DIAGNOSIS — N183 Chronic kidney disease, stage 3 (moderate): Secondary | ICD-10-CM | POA: Diagnosis not present

## 2017-06-03 DIAGNOSIS — R05 Cough: Secondary | ICD-10-CM | POA: Diagnosis not present

## 2017-06-03 DIAGNOSIS — M791 Myalgia: Secondary | ICD-10-CM | POA: Diagnosis not present

## 2017-06-03 DIAGNOSIS — Z79899 Other long term (current) drug therapy: Secondary | ICD-10-CM | POA: Diagnosis not present

## 2017-06-03 DIAGNOSIS — I129 Hypertensive chronic kidney disease with stage 1 through stage 4 chronic kidney disease, or unspecified chronic kidney disease: Secondary | ICD-10-CM | POA: Diagnosis not present

## 2017-06-05 ENCOUNTER — Encounter (HOSPITAL_COMMUNITY): Payer: Self-pay | Admitting: Emergency Medicine

## 2017-06-05 ENCOUNTER — Emergency Department (HOSPITAL_COMMUNITY)
Admission: EM | Admit: 2017-06-05 | Discharge: 2017-06-05 | Discharge status: Against Medical Advice | Payer: Medicare Other | Attending: Emergency Medicine | Admitting: Emergency Medicine

## 2017-06-05 ENCOUNTER — Emergency Department (HOSPITAL_COMMUNITY): Payer: Medicare Other

## 2017-06-05 DIAGNOSIS — R1084 Generalized abdominal pain: Secondary | ICD-10-CM | POA: Diagnosis not present

## 2017-06-05 DIAGNOSIS — W19XXXA Unspecified fall, initial encounter: Secondary | ICD-10-CM

## 2017-06-05 DIAGNOSIS — R0781 Pleurodynia: Secondary | ICD-10-CM | POA: Diagnosis not present

## 2017-06-05 DIAGNOSIS — Z5321 Procedure and treatment not carried out due to patient leaving prior to being seen by health care provider: Secondary | ICD-10-CM | POA: Insufficient documentation

## 2017-06-05 NOTE — ED Triage Notes (Addendum)
Pt arrives from [facility] independent living via POV reporting mechanical fall last night. Pt c/o L lateral abd pain, tender to palpation, no bruising noted.  Pt's daughter reports frequent falls, reports chest xray yesterday was clear.  Pt denies CP, SOB, dizziness.  Resp e/u.

## 2017-06-05 NOTE — ED Notes (Signed)
Pt up to desk, turned in stickers to EMT and stated they were leaving.  Will d/c.

## 2017-06-11 ENCOUNTER — Inpatient Hospital Stay (HOSPITAL_COMMUNITY)
Admission: EM | Admit: 2017-06-11 | Discharge: 2017-06-13 | DRG: 308 | Disposition: A | Discharge status: Hospice | Payer: Medicare Other | Attending: Internal Medicine | Admitting: Internal Medicine

## 2017-06-11 DIAGNOSIS — Z66 Do not resuscitate: Secondary | ICD-10-CM | POA: Diagnosis present

## 2017-06-11 DIAGNOSIS — E1165 Type 2 diabetes mellitus with hyperglycemia: Secondary | ICD-10-CM | POA: Diagnosis present

## 2017-06-11 DIAGNOSIS — F0390 Unspecified dementia without behavioral disturbance: Secondary | ICD-10-CM | POA: Diagnosis not present

## 2017-06-11 DIAGNOSIS — Z9861 Coronary angioplasty status: Secondary | ICD-10-CM

## 2017-06-11 DIAGNOSIS — N289 Disorder of kidney and ureter, unspecified: Secondary | ICD-10-CM | POA: Diagnosis not present

## 2017-06-11 DIAGNOSIS — I442 Atrioventricular block, complete: Principal | ICD-10-CM

## 2017-06-11 DIAGNOSIS — N183 Chronic kidney disease, stage 3 (moderate): Secondary | ICD-10-CM | POA: Diagnosis present

## 2017-06-11 DIAGNOSIS — S0990XA Unspecified injury of head, initial encounter: Secondary | ICD-10-CM | POA: Diagnosis not present

## 2017-06-11 DIAGNOSIS — Z515 Encounter for palliative care: Secondary | ICD-10-CM | POA: Diagnosis present

## 2017-06-11 DIAGNOSIS — Z87891 Personal history of nicotine dependence: Secondary | ICD-10-CM

## 2017-06-11 DIAGNOSIS — E1142 Type 2 diabetes mellitus with diabetic polyneuropathy: Secondary | ICD-10-CM | POA: Diagnosis present

## 2017-06-11 DIAGNOSIS — R451 Restlessness and agitation: Secondary | ICD-10-CM | POA: Diagnosis present

## 2017-06-11 DIAGNOSIS — Z8673 Personal history of transient ischemic attack (TIA), and cerebral infarction without residual deficits: Secondary | ICD-10-CM

## 2017-06-11 DIAGNOSIS — R918 Other nonspecific abnormal finding of lung field: Secondary | ICD-10-CM | POA: Diagnosis not present

## 2017-06-11 DIAGNOSIS — I129 Hypertensive chronic kidney disease with stage 1 through stage 4 chronic kidney disease, or unspecified chronic kidney disease: Secondary | ICD-10-CM | POA: Diagnosis present

## 2017-06-11 DIAGNOSIS — G9341 Metabolic encephalopathy: Secondary | ICD-10-CM | POA: Diagnosis present

## 2017-06-11 DIAGNOSIS — Z794 Long term (current) use of insulin: Secondary | ICD-10-CM

## 2017-06-11 DIAGNOSIS — I2510 Atherosclerotic heart disease of native coronary artery without angina pectoris: Secondary | ICD-10-CM | POA: Diagnosis present

## 2017-06-11 DIAGNOSIS — Z833 Family history of diabetes mellitus: Secondary | ICD-10-CM

## 2017-06-11 DIAGNOSIS — R739 Hyperglycemia, unspecified: Secondary | ICD-10-CM | POA: Diagnosis not present

## 2017-06-11 DIAGNOSIS — I459 Conduction disorder, unspecified: Secondary | ICD-10-CM | POA: Diagnosis present

## 2017-06-11 DIAGNOSIS — Z8249 Family history of ischemic heart disease and other diseases of the circulatory system: Secondary | ICD-10-CM

## 2017-06-11 DIAGNOSIS — R4020 Unspecified coma: Secondary | ICD-10-CM | POA: Diagnosis not present

## 2017-06-11 DIAGNOSIS — Z9842 Cataract extraction status, left eye: Secondary | ICD-10-CM

## 2017-06-11 DIAGNOSIS — N400 Enlarged prostate without lower urinary tract symptoms: Secondary | ICD-10-CM | POA: Diagnosis not present

## 2017-06-11 DIAGNOSIS — E782 Mixed hyperlipidemia: Secondary | ICD-10-CM | POA: Diagnosis present

## 2017-06-11 DIAGNOSIS — I447 Left bundle-branch block, unspecified: Secondary | ICD-10-CM | POA: Diagnosis present

## 2017-06-11 DIAGNOSIS — E1122 Type 2 diabetes mellitus with diabetic chronic kidney disease: Secondary | ICD-10-CM | POA: Diagnosis present

## 2017-06-11 DIAGNOSIS — Z7902 Long term (current) use of antithrombotics/antiplatelets: Secondary | ICD-10-CM

## 2017-06-11 DIAGNOSIS — J449 Chronic obstructive pulmonary disease, unspecified: Secondary | ICD-10-CM | POA: Diagnosis present

## 2017-06-11 DIAGNOSIS — E1151 Type 2 diabetes mellitus with diabetic peripheral angiopathy without gangrene: Secondary | ICD-10-CM | POA: Diagnosis present

## 2017-06-11 DIAGNOSIS — Z888 Allergy status to other drugs, medicaments and biological substances status: Secondary | ICD-10-CM

## 2017-06-11 DIAGNOSIS — Z9841 Cataract extraction status, right eye: Secondary | ICD-10-CM

## 2017-06-11 NOTE — ED Triage Notes (Signed)
Pt come from [facility] independent living, pt cbg was 500 plus, facility doctor ordered insulin and then rechecked cbg and found it to be 250 ,ordered fluids and called EMS, ems arrived to combative pt gave 5 versed im right shoulder. Pt cbg 107 v/s on arrival hr 40, rr26, bp158/80, sinus bradycardia. DNR possible  Family in room ( daughter).

## 2017-06-12 ENCOUNTER — Emergency Department (HOSPITAL_COMMUNITY): Payer: Medicare Other

## 2017-06-12 ENCOUNTER — Other Ambulatory Visit: Payer: Self-pay

## 2017-06-12 ENCOUNTER — Encounter (HOSPITAL_COMMUNITY): Payer: Self-pay

## 2017-06-12 DIAGNOSIS — Z8249 Family history of ischemic heart disease and other diseases of the circulatory system: Secondary | ICD-10-CM | POA: Diagnosis not present

## 2017-06-12 DIAGNOSIS — N289 Disorder of kidney and ureter, unspecified: Secondary | ICD-10-CM | POA: Diagnosis not present

## 2017-06-12 DIAGNOSIS — R4020 Unspecified coma: Secondary | ICD-10-CM | POA: Diagnosis not present

## 2017-06-12 DIAGNOSIS — Z515 Encounter for palliative care: Secondary | ICD-10-CM | POA: Diagnosis present

## 2017-06-12 DIAGNOSIS — I459 Conduction disorder, unspecified: Secondary | ICD-10-CM | POA: Diagnosis not present

## 2017-06-12 DIAGNOSIS — Z87891 Personal history of nicotine dependence: Secondary | ICD-10-CM | POA: Diagnosis not present

## 2017-06-12 DIAGNOSIS — N183 Chronic kidney disease, stage 3 (moderate): Secondary | ICD-10-CM | POA: Diagnosis present

## 2017-06-12 DIAGNOSIS — I442 Atrioventricular block, complete: Secondary | ICD-10-CM

## 2017-06-12 DIAGNOSIS — E1122 Type 2 diabetes mellitus with diabetic chronic kidney disease: Secondary | ICD-10-CM | POA: Diagnosis present

## 2017-06-12 DIAGNOSIS — I129 Hypertensive chronic kidney disease with stage 1 through stage 4 chronic kidney disease, or unspecified chronic kidney disease: Secondary | ICD-10-CM | POA: Diagnosis present

## 2017-06-12 DIAGNOSIS — R918 Other nonspecific abnormal finding of lung field: Secondary | ICD-10-CM | POA: Diagnosis not present

## 2017-06-12 DIAGNOSIS — Z833 Family history of diabetes mellitus: Secondary | ICD-10-CM | POA: Diagnosis not present

## 2017-06-12 DIAGNOSIS — G9341 Metabolic encephalopathy: Secondary | ICD-10-CM | POA: Diagnosis present

## 2017-06-12 DIAGNOSIS — R451 Restlessness and agitation: Secondary | ICD-10-CM

## 2017-06-12 DIAGNOSIS — I2510 Atherosclerotic heart disease of native coronary artery without angina pectoris: Secondary | ICD-10-CM | POA: Diagnosis present

## 2017-06-12 DIAGNOSIS — Z9842 Cataract extraction status, left eye: Secondary | ICD-10-CM | POA: Diagnosis not present

## 2017-06-12 DIAGNOSIS — S0990XA Unspecified injury of head, initial encounter: Secondary | ICD-10-CM | POA: Diagnosis not present

## 2017-06-12 DIAGNOSIS — R4182 Altered mental status, unspecified: Secondary | ICD-10-CM | POA: Diagnosis not present

## 2017-06-12 DIAGNOSIS — I509 Heart failure, unspecified: Secondary | ICD-10-CM | POA: Diagnosis not present

## 2017-06-12 DIAGNOSIS — E1165 Type 2 diabetes mellitus with hyperglycemia: Secondary | ICD-10-CM | POA: Diagnosis present

## 2017-06-12 DIAGNOSIS — F0390 Unspecified dementia without behavioral disturbance: Secondary | ICD-10-CM | POA: Diagnosis present

## 2017-06-12 DIAGNOSIS — Z7902 Long term (current) use of antithrombotics/antiplatelets: Secondary | ICD-10-CM | POA: Diagnosis not present

## 2017-06-12 DIAGNOSIS — Z794 Long term (current) use of insulin: Secondary | ICD-10-CM | POA: Diagnosis not present

## 2017-06-12 DIAGNOSIS — I447 Left bundle-branch block, unspecified: Secondary | ICD-10-CM | POA: Diagnosis present

## 2017-06-12 DIAGNOSIS — Z66 Do not resuscitate: Secondary | ICD-10-CM | POA: Diagnosis present

## 2017-06-12 DIAGNOSIS — E782 Mixed hyperlipidemia: Secondary | ICD-10-CM | POA: Diagnosis present

## 2017-06-12 DIAGNOSIS — Z9841 Cataract extraction status, right eye: Secondary | ICD-10-CM | POA: Diagnosis not present

## 2017-06-12 DIAGNOSIS — E1151 Type 2 diabetes mellitus with diabetic peripheral angiopathy without gangrene: Secondary | ICD-10-CM | POA: Diagnosis present

## 2017-06-12 DIAGNOSIS — Z888 Allergy status to other drugs, medicaments and biological substances status: Secondary | ICD-10-CM | POA: Diagnosis not present

## 2017-06-12 DIAGNOSIS — N400 Enlarged prostate without lower urinary tract symptoms: Secondary | ICD-10-CM | POA: Diagnosis present

## 2017-06-12 DIAGNOSIS — E1142 Type 2 diabetes mellitus with diabetic polyneuropathy: Secondary | ICD-10-CM | POA: Diagnosis present

## 2017-06-12 DIAGNOSIS — Z9861 Coronary angioplasty status: Secondary | ICD-10-CM | POA: Diagnosis not present

## 2017-06-12 LAB — CBC WITH DIFFERENTIAL/PLATELET
BASOS ABS: 0 10*3/uL (ref 0.0–0.1)
BASOS PCT: 0 %
Eosinophils Absolute: 0.1 10*3/uL (ref 0.0–0.7)
Eosinophils Relative: 1 %
HEMATOCRIT: 38.5 % — AB (ref 39.0–52.0)
HEMOGLOBIN: 12.9 g/dL — AB (ref 13.0–17.0)
LYMPHS PCT: 9 %
Lymphs Abs: 0.7 10*3/uL (ref 0.7–4.0)
MCH: 33.8 pg (ref 26.0–34.0)
MCHC: 33.5 g/dL (ref 30.0–36.0)
MCV: 100.8 fL — ABNORMAL HIGH (ref 78.0–100.0)
Monocytes Absolute: 0.7 10*3/uL (ref 0.1–1.0)
Monocytes Relative: 9 %
NEUTROS ABS: 6.3 10*3/uL (ref 1.7–7.7)
Neutrophils Relative %: 81 %
Platelets: 191 10*3/uL (ref 150–400)
RBC: 3.82 MIL/uL — ABNORMAL LOW (ref 4.22–5.81)
RDW: 14.1 % (ref 11.5–15.5)
WBC: 7.8 10*3/uL (ref 4.0–10.5)

## 2017-06-12 LAB — GLUCOSE, CAPILLARY
GLUCOSE-CAPILLARY: 142 mg/dL — AB (ref 65–99)
GLUCOSE-CAPILLARY: 139 mg/dL — AB (ref 65–99)
Glucose-Capillary: 259 mg/dL — ABNORMAL HIGH (ref 65–99)
Glucose-Capillary: 162 mg/dL — ABNORMAL HIGH (ref 65–99)

## 2017-06-12 LAB — COMPREHENSIVE METABOLIC PANEL
ALBUMIN: 2.9 g/dL — AB (ref 3.5–5.0)
ALK PHOS: 82 U/L (ref 38–126)
ALT: 21 U/L (ref 17–63)
AST: 34 U/L (ref 15–41)
Anion gap: 6 (ref 5–15)
BUN: 28 mg/dL — ABNORMAL HIGH (ref 6–20)
CALCIUM: 8.1 mg/dL — AB (ref 8.9–10.3)
CO2: 25 mmol/L (ref 22–32)
Chloride: 109 mmol/L (ref 101–111)
Creatinine, Ser: 1.41 mg/dL — ABNORMAL HIGH (ref 0.61–1.24)
GFR calc Af Amer: 47 mL/min — ABNORMAL LOW (ref >60)
GFR calc non Af Amer: 41 mL/min — ABNORMAL LOW (ref >60)
GLUCOSE: 98 mg/dL (ref 65–99)
POTASSIUM: 3.7 mmol/L (ref 3.5–5.1)
Sodium: 140 mmol/L (ref 135–145)
TOTAL PROTEIN: 5.5 g/dL — AB (ref 6.5–8.1)
Total Bilirubin: 0.8 mg/dL (ref 0.3–1.2)

## 2017-06-12 LAB — URINALYSIS, ROUTINE W REFLEX MICROSCOPIC
BILIRUBIN URINE: NEGATIVE
Ketones, ur: 5 mg/dL — AB
LEUKOCYTES UA: NEGATIVE
NITRITE: NEGATIVE
SQUAMOUS EPITHELIAL / LPF: NONE SEEN
Specific Gravity, Urine: 1.024 (ref 1.005–1.030)
pH: 5 (ref 5.0–8.0)

## 2017-06-12 LAB — CBG MONITORING, ED: Glucose-Capillary: 93 mg/dL (ref 65–99)

## 2017-06-12 MED ORDER — SODIUM CHLORIDE 0.9 % IV BOLUS (SEPSIS)
1000.0000 mL | Freq: Once | INTRAVENOUS | Status: AC
  Administered 2017-06-12: 1000 mL via INTRAVENOUS

## 2017-06-12 MED ORDER — ATROPINE SULFATE 1 MG/10ML IJ SOSY
1.0000 mg | PREFILLED_SYRINGE | Freq: Once | INTRAMUSCULAR | Status: AC
  Administered 2017-06-12: 1 mg via INTRAVENOUS
  Filled 2017-06-12: qty 10

## 2017-06-12 MED ORDER — ALBUTEROL SULFATE (2.5 MG/3ML) 0.083% IN NEBU: 2.5000 mg | INHALATION_SOLUTION | RESPIRATORY_TRACT | Status: DC | PRN

## 2017-06-12 MED ORDER — INSULIN ASPART 100 UNIT/ML ~~LOC~~ SOLN
0.0000 [IU] | SUBCUTANEOUS | Status: DC
  Administered 2017-06-12: 2 [IU] via SUBCUTANEOUS
  Administered 2017-06-12: 1 [IU] via SUBCUTANEOUS
  Administered 2017-06-12: 5 [IU] via SUBCUTANEOUS
  Administered 2017-06-13: 2 [IU] via SUBCUTANEOUS
  Administered 2017-06-13: 5 [IU] via SUBCUTANEOUS
  Administered 2017-06-13: 3 [IU] via SUBCUTANEOUS

## 2017-06-12 MED ORDER — LORAZEPAM 2 MG/ML PO CONC: 1.0000 mg | ORAL | Status: DC | PRN

## 2017-06-12 MED ORDER — ONDANSETRON HCL 4 MG/2ML IJ SOLN: 4.0000 mg | Freq: Four times a day (QID) | INTRAMUSCULAR | Status: DC | PRN

## 2017-06-12 MED ORDER — ACETAMINOPHEN 500 MG PO TABS: 1000.0000 mg | ORAL_TABLET | Freq: Four times a day (QID) | ORAL | Status: DC | PRN

## 2017-06-12 MED ORDER — LORAZEPAM 2 MG/ML IJ SOLN: 1.0000 mg | INTRAMUSCULAR | Status: DC | PRN

## 2017-06-12 MED ORDER — ONDANSETRON 4 MG PO TBDP: 4.0000 mg | ORAL_TABLET | Freq: Four times a day (QID) | ORAL | Status: DC | PRN

## 2017-06-12 MED ORDER — POLYETHYLENE GLYCOL 3350 17 G PO PACK
17.0000 g | PACK | Freq: Every day | ORAL | Status: DC | PRN
  Filled 2017-06-12: qty 1

## 2017-06-12 MED ORDER — MORPHINE SULFATE (CONCENTRATE) 10 MG/0.5ML PO SOLN: 5.0000 mg | ORAL | Status: DC | PRN

## 2017-06-12 MED ORDER — ACETAMINOPHEN 650 MG RE SUPP: 650.0000 mg | Freq: Four times a day (QID) | RECTAL | Status: DC | PRN

## 2017-06-12 MED ORDER — ACETAMINOPHEN 325 MG PO TABS: 650.0000 mg | ORAL_TABLET | Freq: Four times a day (QID) | ORAL | Status: DC | PRN

## 2017-06-12 MED ORDER — CLOPIDOGREL BISULFATE 75 MG PO TABS
75.0000 mg | ORAL_TABLET | Freq: Every day | ORAL | Status: DC
  Administered 2017-06-12: 75 mg via ORAL
  Filled 2017-06-12: qty 1

## 2017-06-12 MED ORDER — POLYETHYLENE GLYCOL 3350 17 GM/SCOOP PO POWD
17.0000 g | Freq: Every day | ORAL | Status: DC | PRN
  Filled 2017-06-12: qty 255

## 2017-06-12 MED ORDER — LORAZEPAM 1 MG PO TABS: 1.0000 mg | ORAL_TABLET | ORAL | Status: DC | PRN

## 2017-06-12 NOTE — Clinical Social Work Note (Signed)
Clinical Social Work Assessment  Patient Details  Name: Andrew Weaver MRN: [id number] Date of Birth: September 05, 1919  Date of referral:  06/12/17               Reason for consult:   (pt admitted from facility)                Permission sought to share information with:  Family Supports Permission granted to share information::     Name::     daughter [name], Son-in-law  Agency::  [facility]  Relationship::     Contact Information:     Housing/Transportation Living arrangements for the past 2 months:  [place] of Information:  Adult Children, Facility Patient Interpreter Needed:  None Criminal Activity/Legal Involvement Pertinent to Current Situation/Hospitalization:  No - Comment as needed Significant Relationships:  Adult Children, Warehouse manager Lives with:  Self, Facility Resident Do you feel safe going back to the place where you live?  Yes Need for family participation in patient care:  Yes (Comment) (family involved in care decisions)  Care giving concerns:  Pt from [facility]- was independent living resident but was admitting to SNF building for rehab due to recent falls just yesterday. Per facility, was unable to access IV and was confused/refusing care. Baseline has some confusion consistent with dementia however now exacerbated.    Social Worker assessment / plan:  CSW consulted as pt is admitted from facility- [facility]. Has been independent living resident and was admitting to rehab section last night prior to hospital arrival. Met with pt/daughter/son-in-law. Pt sleeping. Family confirms report above. CSW contacted facility as well- states rehab section bed will be available for pt if needed at DC. Pt's family pleased however they state they are beginning to deliberate care plan options based on pt's progress and will plan when more of pt's prognosis/condition known.  Plan: TBD. From [facility] but unable to make DC plans at this time. CSW will  follow.  Employment status:  Retired Forensic scientist:  Medicare PT Recommendations:  Not assessed at this time [place] / Referral to community resources:  [place]  Patient/Family's Response to care:  Pt sleeping- family engaged and appreciative of care  Patient/Family's Understanding of and Emotional Response to Diagnosis, Current Treatment, and Prognosis:  Pt unable to demonstrate understanding of current treatment. Family understanding but interaction brief. Emotionally calm and appropriate to situation.  Emotional Assessment Appearance:  Appears stated age Attitude/Demeanor/Rapport:   (sleeping) Affect (typically observed):   (sleeping) Orientation:  Oriented to Self, Oriented to Place (UTA- oriented x2 per staff) Alcohol / Substance use:  Not Applicable Psych involvement (Current and /or in the community):  No (Comment)  Discharge Needs  Concerns to be addressed:   (still assessing) Readmission within the last 30 days:  No Current discharge risk:  None Barriers to Discharge:  Continued Medical Work up   [employer], LCSW 06/12/2017, 3:17 PM  [phone]

## 2017-06-12 NOTE — Consult Note (Signed)
Consultation Note Date: 06/12/2017   Patient Name: Andrew Weaver  DOB: [birth year]  MRN: [id number]  Age / Sex: 83 y.o., male  PCP: Lajean Manes, MD Referring Physician: Reyne Dumas, MD  Reason for Consultation: Evaluation  HPI/Patient Profile: 83 year old male with a history of 2 stents in the LAD in 2007 ( PTCA in 2000], CVA, chronic kidney disease, dyslipidemia, peripheral vascular disease, from [facility] independent living. Per the EMR notes, he presented on 9/20 because of mechanical fall. Per the EMR patient presented again on 9/26 because of elevated sugars of 500, altered mental status, combativeness and confusion. Patient found to have a heart rate of 38 -40. Patient found to be in severe bradycardia. ECG shows third-degree AV block.   Clinical Assessment and Goals of Care: Patient resting in bed at this time, he awakens to say hello but falls back asleep. No family at bedside.  Spoke with patient's daughter (POA) Marquita Palms via number listed. She states she spoke with primary team regarding care planning, and confirms DNR status, and desires no escalation in care. [name] states her plan is for a nursing facility where he can go to spend the rest of his life, and describes that this is not for rehab purposes.  Discussed Hospice facility placement and she would like to speak with someone from [facility]. SW consult placed.    HCPOA Phone number for [employer] listed.     SUMMARY OF RECOMMENDATIONS   SW consult placed for [facility] referral.    Code Status/Advance Care Planning:  DNR    Symptom Management:   Medications in place per primary team    Additional Recommendations (Limitations, Scope, Preferences):  No escalation of care.   Prognosis:   < 2 weeks Very poor oral intake. HR is 30's-40's from 3rd degree heart block.   Discharge Planning: Hospice facility       Primary Diagnoses: Present on Admission: . Agitation . Heart block   I have reviewed the medical record, interviewed the patient and family, and examined the patient. The following aspects are pertinent.  Past Medical History:  Diagnosis Date  . BPH (benign prostatic hypertrophy)   . CAD (coronary artery disease)   . Chronic kidney disease    borderline stage II/stage III  . Diabetes mellitus   . Hyperlipidemia    goal LDL less than 70  . Hypertension   . Lock jaw   . PVD (peripheral vascular disease) ([place])    Social History   Social History  . Marital status: Married    Spouse name: N/A  . Number of children: 1  . Years of education: N/A   Occupational History  . retired    Social History Main Topics  . Smoking status: Former Smoker    Types: Cigarettes    Quit date: 09/17/1991  . Smokeless tobacco: Never Used  . Alcohol use 0.0 oz/week     Comment: social  . Drug use: No  . Sexual activity: No   Other Topics Concern  .  None   Social History Narrative  . None   Family History  Problem Relation Age of Onset  . Heart disease Mother   . Heart attack Mother   . Heart disease Father   . Diabetes Father   . Heart attack Father    Scheduled Meds: . clopidogrel  75 mg Oral Daily  . insulin aspart  0-9 Units Subcutaneous Q4H   Continuous Infusions: PRN Meds:.acetaminophen **OR** acetaminophen, acetaminophen, albuterol, LORazepam **OR** LORazepam **OR** LORazepam, morphine CONCENTRATE **OR** morphine CONCENTRATE, ondansetron **OR** ondansetron (ZOFRAN) IV, polyethylene glycol Medications Prior to Admission:  Prior to Admission medications   Medication Sig Start Date End Date Taking? Authorizing Provider  acetaminophen (TYLENOL) 500 MG tablet Take 1,000 mg by mouth every 6 (six) hours as needed for moderate pain.   Yes [provider]  clopidogrel (PLAVIX) 75 MG tablet Take 75 mg by mouth daily.  08/23/13  Yes [provider]  insulin  detemir (LEVEMIR) 100 UNIT/ML injection Inject 20-25 Units into the skin at bedtime.    Yes [provider]  insulin regular (NOVOLIN R,HUMULIN R) 100 units/mL injection Inject 5-10 Units into the skin 3 (three) times daily before meals.    Yes [provider]  losartan (COZAAR) 100 MG tablet Take 100 mg by mouth every evening. 4pm   Yes [provider]  polyethylene glycol powder (GLYCOLAX/MIRALAX) powder Take 17 g by mouth daily as needed (constipation). CONSTIPATION 12/14/15  Yes [provider]  nitroGLYCERIN (NITROSTAT) 0.4 MG SL tablet Place 1 tablet (0.4 mg total) under the tongue every 5 (five) minutes as needed for chest pain (3 DOSES MAX). 12/18/16   Jettie Booze, MD  ONE TOUCH ULTRA TEST test strip TEST TID UTD 03/14/16   [provider]   Allergies  Allergen Reactions  . Lipitor [Atorvastatin] Other (See Comments)    Fatigue - pt tolerates 10 mg dose   Review of Systems  Unable to perform ROS   Physical Exam  HENT:  Head: Normocephalic.  Cardiovascular:  Warm and dry  Pulmonary/Chest: Effort normal.  Abdominal: Soft.  Neurological:  Awakens to verbal stimuli and falls back asleep quickly.    Vital Signs: BP 139/75 (BP Location: Right Arm)   Pulse (!) 36   Temp (!) 97.4 F (36.3 C) (Oral)   Resp 20   Ht 6\' 4"  (1.93 m)   Wt 90 kg (198 lb 6.6 oz)   SpO2 98%   BMI 24.15 kg/m  Pain Assessment: No/denies pain       SpO2: SpO2: 98 % O2 Device:SpO2: 98 % O2 Flow Rate: .O2 Flow Rate (L/min): 2 L/min  IO: Intake/output summary:  Intake/Output Summary (Last 24 hours) at 06/12/17 1650 Last data filed at 06/12/17 1005  Gross per 24 hour  Intake              240 ml  Output              350 ml  Net             -110 ml    LBM:   Baseline Weight: Weight: 90 kg (198 lb 6.6 oz) Most recent weight: Weight: 90 kg (198 lb 6.6 oz)     Palliative Assessment/Data: 20%     Time In: 4:00 Time Out: 5:30 Time Total: 1.5  hours Greater than 50%  of this time was spent counseling and coordinating care related to the above assessment and plan.  Signed by: Asencion Gowda, NP  06/12/2017 5:24 PM Office: [phone] 7am-7pm  Call primary team after hours  Please contact Palliative Medicine Team phone at [phone] for questions and concerns.  For individual provider: See Shea Evans

## 2017-06-12 NOTE — ED Provider Notes (Signed)
[facility] DEPT Provider Note   CSN: [id number] Arrival date & time: 06/11/17  2323     History   Chief Complaint Chief Complaint  Patient presents with  . Hyperglycemia  . Aggressive Behavior    was given IM of versed 5mg      HPI Andrew Weaver is a 83 y.o. male.  The history is provided by the nursing home and a relative. The history is limited by the condition of the patient and a language barrier (altered mental status).  He had a fall last week in the injured his left rib cage. He came to emergency and had x-rays, but left because of long wait. Since then, he has been more agitated than normal. He has baseline dementia with confusion. However, he has developed urinary incontinence and is refusing to have caregivers had changed him. He is also not eating or drinking. There's been no fever or chills. He is not complaining of any pain. Daughter states that he is DO NOT RESUSCITATE and does not 1 any major interventions such as surgery, intubation, pacemaker. He was sent to the ED tonight because glucose was over 500. EMS reports that he was combative and they sedated him with midazolam. Also, daughter states that he was taken off of Lipitor last week.  Past Medical History:  Diagnosis Date  . BPH (benign prostatic hypertrophy)   . CAD (coronary artery disease)   . Chronic kidney disease    borderline stage II/stage III  . Diabetes mellitus   . Hyperlipidemia    goal LDL less than 70  . Hypertension   . Lock jaw   . PVD (peripheral vascular disease) [facility])     Patient Active Problem List   Diagnosis Date Noted  . Mild cognitive impairment 07/09/2016  . Aphasia 04/27/2016  . A-fib ([place]) 04/27/2016  . Syncope 10/09/2015  . Fall 10/09/2015  . Type II or unspecified type diabetes mellitus with neurological manifestations, not stated as uncontrolled(250.60) 11/10/2013  . Diabetic polyneuropathy associated with type 2 diabetes mellitus ([place]) 11/10/2013  . Coronary  atherosclerosis of native coronary artery 11/10/2013  . Mixed hyperlipidemia 11/10/2013  . Hypertension     Past Surgical History:  Procedure Laterality Date  . CATARACT EXTRACTION Bilateral   . CORONARY ANGIOPLASTY     with stent  . PATELLA FRACTURE SURGERY Left        Home Medications    Prior to Admission medications   Medication Sig Start Date End Date Taking? Authorizing Provider  atorvastatin (LIPITOR) 10 MG tablet Take 10 mg by mouth every evening. 4pm 04/22/16   [provider]  clopidogrel (PLAVIX) 75 MG tablet Take 75 mg by mouth daily.  08/23/13   [provider]  insulin detemir (LEVEMIR) 100 UNIT/ML injection Inject 20-25 Units into the skin daily with breakfast.     [provider]  insulin regular (NOVOLIN R,HUMULIN R) 100 units/mL injection Inject 5-10 Units into the skin 3 (three) times daily before meals.     [provider]  losartan (COZAAR) 100 MG tablet Take 100 mg by mouth every evening. 4pm    [provider]  nitroGLYCERIN (NITROSTAT) 0.4 MG SL tablet Place 1 tablet (0.4 mg total) under the tongue every 5 (five) minutes as needed for chest pain (3 DOSES MAX). 12/18/16   Jettie Booze, MD  ONE TOUCH ULTRA TEST test strip TEST TID UTD 03/14/16   [provider]  polyethylene glycol powder (GLYCOLAX/MIRALAX) powder Take 17 g by mouth  daily as needed (constipation). CONSTIPATION 12/14/15   [provider]    Family History Family History  Problem Relation Age of Onset  . Heart disease Mother   . Heart attack Mother   . Heart disease Father   . Diabetes Father   . Heart attack Father     Social History Social History  Substance Use Topics  . Smoking status: Former Smoker    Types: Cigarettes    Quit date: 09/17/1991  . Smokeless tobacco: Never Used  . Alcohol use 0.0 oz/week     Comment: social     Allergies   Lipitor [atorvastatin]   Review of Systems Review of Systems  Unable  to perform ROS: Mental status change     Physical Exam Updated Vital Signs BP (!) 142/56 (BP Location: Left Arm)   Pulse (!) 38   Temp 97.8 F (36.6 C) (Axillary)   Resp 16   SpO2 92%   Physical Exam  Nursing note and vitals reviewed.  83 year old male, resting comfortably and in no acute distress. Vital signs are significant for severe bradycardia and borderline hypertension. Oxygen saturation is 92%, which is normal. Head is normocephalic and atraumatic. PERRLA, EOMI. Oropharynx is clear. Mucous membranes are dry. Neck is nontender and supple without adenopathy or JVD. Back is nontender and there is no CVA tenderness. Lungs are clear without rales, wheezes, or rhonchi. Chest is nontender. Heart is bradycardic without murmur. Abdomen is soft, flat, nontender without masses or hepatosplenomegaly and peristalsis is normoactive. Extremities have no cyanosis or edema, full range of motion is present. Skin is warm and dry without rash. Neurologic: He is somnolent but arousable. He is nonverbal when aroused. He is intermittently agitated. No focal motor or sensory deficits.  ED Treatments / Results  Labs (all labs ordered are listed, but only abnormal results are displayed) Labs Reviewed  COMPREHENSIVE METABOLIC PANEL - Abnormal; Notable for the following:       Result Value   BUN 28 (*)    Creatinine, Ser 1.41 (*)    Calcium 8.1 (*)    Total Protein 5.5 (*)    Albumin 2.9 (*)    GFR calc non Af Amer 41 (*)    GFR calc Af Amer 47 (*)    All other components within normal limits  CBC WITH DIFFERENTIAL/PLATELET - Abnormal; Notable for the following:    RBC 3.82 (*)    Hemoglobin 12.9 (*)    HCT 38.5 (*)    MCV 100.8 (*)    All other components within normal limits  URINALYSIS, ROUTINE W REFLEX MICROSCOPIC - Abnormal; Notable for the following:    Glucose, UA >=500 (*)    Hgb urine dipstick MODERATE (*)    Ketones, ur 5 (*)    Protein, ur >=300 (*)    Bacteria, UA RARE  (*)    All other components within normal limits  GLUCOSE, CAPILLARY - Abnormal; Notable for the following:    Glucose-Capillary 142 (*)    All other components within normal limits  CBG MONITORING, ED    EKG  EKG Interpretation  Date/Time:  Thursday June 12 2017 00:12:54 EDT Ventricular Rate:  37 PR Interval:    QRS Duration: 117 QT Interval:  678 QTC Calculation: 532 R Axis:   -15 Text Interpretation:  AV block, complete (third degree) Incomplete left bundle branch block Borderline low voltage, extremity leads When compared with ECG of 04/29/2016, Third degree heart block is now present Confirmed  by Delora Fuel ([id number]) on 06/12/2017 12:34:54 AM       Radiology Ct Head Wo Contrast  Result Date: 06/12/2017 CLINICAL DATA:  Altered level of consciousness. Elevated blood sugar levels. Patient is combative. Patient fell last week per daughter. EXAM: CT HEAD WITHOUT CONTRAST TECHNIQUE: Contiguous axial images were obtained from the base of the skull through the vertex without intravenous contrast. COMPARISON:  04/27/2016 FINDINGS: BRAIN: There is sulcal and ventricular prominence consistent with superficial and central atrophy. No intraparenchymal hemorrhage, mass effect nor midline shift. Periventricular and subcortical white matter hypodensities consistent with chronic moderate small vessel ischemic disease is again noted. No acute large vascular territory infarcts. No abnormal extra-axial fluid collections. Basal cisterns are not effaced and midline. VASCULAR: Moderate calcific atherosclerosis of the carotid siphons. SKULL: No skull fracture. Motion related artifacts slightly limit assessment the skullbase. No significant scalp soft tissue swelling. SINUSES/ORBITS: The mastoid air-cells are clear. The included paranasal sinuses are well-aerated.The included ocular globes and orbital contents are non-suspicious. Left cataract extraction. OTHER: None. IMPRESSION: Atrophy with chronic  moderate small vessel ischemia. No acute intracranial abnormality. Electronically Signed   By: Ashley Royalty M.D.   On: 06/12/2017 03:45   Dg Chest Port 1 View  Result Date: 06/12/2017 CLINICAL DATA:  83 y/o  M; bradycardia and altered mental status. EXAM: PORTABLE CHEST 1 VIEW COMPARISON:  06/03/2017 chest radiograph. FINDINGS: Stable cardiac silhouette given differences in technique. Increase interstitial markings and diffuse hazy opacification of the lungs. Elevated left hemidiaphragm. Aortic atherosclerosis with calcification. No acute osseous abnormality is evident. IMPRESSION: Interval increase interstitial markings and hazy opacification of lungs probably representing pulmonary edema. Underlying pneumonia not excluded. Stable elevated left hemidiaphragm. Electronically Signed   By: Kristine Garbe M.D.   On: 06/12/2017 00:52    Procedures Procedures (including critical care time) CRITICAL CARE Performed by: AYTKZ,SWFUX Total critical care time: 40 minutes Critical care time was exclusive of separately billable procedures and treating other patients. Critical care was necessary to treat or prevent imminent or life-threatening deterioration. Critical care was time spent personally by me on the following activities: development of treatment plan with patient and/or surrogate as well as nursing, discussions with consultants, evaluation of patient's response to treatment, examination of patient, obtaining history from patient or surrogate, ordering and performing treatments and interventions, ordering and review of laboratory studies, ordering and review of radiographic studies, pulse oximetry and re-evaluation of patient's condition.   Medications Ordered in ED Medications  acetaminophen (TYLENOL) tablet 650 mg (not administered)    Or  acetaminophen (TYLENOL) suppository 650 mg (not administered)  morphine CONCENTRATE 10 MG/0.5ML oral solution 5 mg (not administered)    Or    morphine CONCENTRATE 10 MG/0.5ML oral solution 5 mg (not administered)  LORazepam (ATIVAN) tablet 1 mg (not administered)    Or  LORazepam (ATIVAN) 2 MG/ML concentrated solution 1 mg (not administered)    Or  LORazepam (ATIVAN) injection 1 mg (not administered)  ondansetron (ZOFRAN-ODT) disintegrating tablet 4 mg (not administered)    Or  ondansetron (ZOFRAN) injection 4 mg (not administered)  albuterol (PROVENTIL) (2.5 MG/3ML) 0.083% nebulizer solution 2.5 mg (not administered)  sodium chloride 0.9 % bolus 1,000 mL (0 mLs Intravenous Stopped 06/12/17 0331)  atropine 1 MG/10ML injection 1 mg (1 mg Intravenous Given 06/12/17 0050)     Initial Impression / Assessment and Plan / ED Course  I have reviewed the triage vital signs and the nursing notes.  Pertinent labs & imaging results that were available  during my care of the patient were reviewed by me and considered in my medical decision making (see chart for details).  Increased agitation since a fall last week. Old records are reviewed, and he had negative rib x-rays at that time. Severe bradycardia. ECG shows third-degree AV block. He is not on any beta blockers or calcium channel blockers to explain this. Daughter confirms that he would not want pacemaker insertion. He will be given a dose of atropine to see if it helps increase his heart rate, but I have doubts that it will be helpful. He will be sent for CT of the head since mental status change occurred after a fall. He does appear dehydrated and will be given IV fluids.  There was no change in his status with above noted treatment. Laboratory evaluation shows stable renal insufficiency. No electrolyte disturbance to account for heart block. I have discussed this with family member. No treatment available short of pacemaker, which she does not wish him to have. At this point, he is appropriate for palliative care consult. He will be admitted for work on palliative care. Palliative care  consult has been requested. Case is discussed with Dr. Hal Hope of [facility] who agrees to admit the patient.  Final Clinical Impressions(s) / ED Diagnoses   Final diagnoses:  Agitation  Complete heart block [facility])  Renal insufficiency    New Prescriptions New Prescriptions   No medications on file     Delora Fuel, MD 77/82/42 [phone]

## 2017-06-12 NOTE — H&P (Signed)
[facility] History and Physical  Chivas Notz WUX:[id number] DOB: 1919/03/22 DOA: 06/11/2017  Referring physician  PCP: Lajean Manes, MD   Chief Complaint:   ED   HPI:   83 year old male with a history of  2 stents in the LAD in 2007 ( PTCA in 2000], CVA, chronic kidney disease, dyslipidemia, peripheral vascular disease, from [facility] independent living presented on 9/20 because of  mechanical fall  . Apparently the patient injured his left rib cage, and since then patient has been more agitated than normal. He has been more confused. He is not eating and drinking. Patient presented again on 9/26 because of elevated sugars of 500, altered mental status, combativeness and confusion. Patient had to be sedated with Versed. Patient found to have a heart rate of 38 -40. Patient found to be in  Severe bradycardia. ECG shows third-degree AV block.  He is not on any beta blockers or calcium channel blockers to explain this. Daughter confirms that he would not want pacemaker insertion. Patient did receive atropine without results. CT head was without any intracranial abnormality. Patient admitted for comfort measures only. Daughter confirmed that the patient is a DO NOT RESUSCITATE      Review of Systems: negative for the following   Unable to perform ROS: Mental status change     Past Medical History:  Diagnosis Date  . BPH (benign prostatic hypertrophy)   . CAD (coronary artery disease)   . Chronic kidney disease    borderline stage II/stage III  . Diabetes mellitus   . Hyperlipidemia    goal LDL less than 70  . Hypertension   . Lock jaw   . PVD (peripheral vascular disease) ([place])      Past Surgical History:  Procedure Laterality Date  . CATARACT EXTRACTION Bilateral   . CORONARY ANGIOPLASTY     with stent  . PATELLA FRACTURE SURGERY Left       Social History:  reports that he quit smoking about 25 years ago. His smoking use included Cigarettes. He has never  used smokeless tobacco. He reports that he drinks alcohol. He reports that he does not use drugs.    Allergies  Allergen Reactions  . Lipitor [Atorvastatin] Other (See Comments)    Fatigue - pt tolerates 10 mg dose    Family History  Problem Relation Age of Onset  . Heart disease Mother   . Heart attack Mother   . Heart disease Father   . Diabetes Father   . Heart attack Father          Prior to Admission medications   Medication Sig Start Date End Date Taking? Authorizing Provider  acetaminophen (TYLENOL) 500 MG tablet Take 1,000 mg by mouth every 6 (six) hours as needed for moderate pain.   Yes [provider]  clopidogrel (PLAVIX) 75 MG tablet Take 75 mg by mouth daily.  08/23/13  Yes [provider]  insulin detemir (LEVEMIR) 100 UNIT/ML injection Inject 20-25 Units into the skin at bedtime.    Yes [provider]  insulin regular (NOVOLIN R,HUMULIN R) 100 units/mL injection Inject 5-10 Units into the skin 3 (three) times daily before meals.    Yes [provider]  losartan (COZAAR) 100 MG tablet Take 100 mg by mouth every evening. 4pm   Yes [provider]  polyethylene glycol powder (GLYCOLAX/MIRALAX) powder Take 17 g by mouth daily as needed (constipation). CONSTIPATION 12/14/15  Yes [provider]  nitroGLYCERIN (NITROSTAT) 0.4 MG SL tablet  Place 1 tablet (0.4 mg total) under the tongue every 5 (five) minutes as needed for chest pain (3 DOSES MAX). 12/18/16   Jettie Booze, MD  ONE [facility] ULTRA TEST test strip TEST TID UTD 03/14/16   [provider]     Physical Exam: Vitals:   06/12/17 0223 06/12/17 0300 06/12/17 0500 06/12/17 0623  BP: 112/83 (!) 144/51 (!) 149/90 (!) 153/85  Pulse: (!) 32 (!) 34 (!) 41 78  Resp: 16 (!) 25 18 18   Temp:    98 F (36.7 C)  TempSrc:    Oral  SpO2: 99% 96% (!) 89% 92%  Weight:    90 kg (198 lb 6.6 oz)  Height:    6\' 4"  (1.93 m)        Vitals:   06/12/17 0223  06/12/17 0300 06/12/17 0500 06/12/17 0623  BP: 112/83 (!) 144/51 (!) 149/90 (!) 153/85  Pulse: (!) 32 (!) 34 (!) 41 78  Resp: 16 (!) 25 18 18   Temp:    98 F (36.7 C)  TempSrc:    Oral  SpO2: 99% 96% (!) 89% 92%  Weight:    90 kg (198 lb 6.6 oz)  Height:    6\' 4"  (1.93 m)   Constitutional: Agitated, fragile, Eyes: PERRL, lids and conjunctivae normal ENMT: Mucous membranes are moist. Posterior pharynx clear of any exudate or lesions.Normal dentition.  Neck: normal, supple, no masses, no thyromegaly Respiratory: clear to auscultation bilaterally, no wheezing, no crackles. Normal respiratory effort. No accessory muscle use.  Cardiovascular: Regular rate and rhythm, no murmurs / rubs / gallops. No extremity edema. 2+ pedal pulses. No carotid bruits.  Abdomen: no tenderness, no masses palpated. No hepatosplenomegaly. Bowel sounds positive.  Musculoskeletal: no clubbing / cyanosis. No joint deformity upper and lower extremities. Good ROM, no contractures. Normal muscle tone.  Skin: no rashes, lesions, ulcers. No induration Neurologic: He is somnolent but arousable. He is nonverbal when aroused. He is intermittently agitated. No focal motor or sensory deficits.    Labs on Admission: I have personally reviewed following labs and imaging studies  CBC:  Recent Labs Lab 06/12/17 0021  WBC 7.8  NEUTROABS 6.3  HGB 12.9*  HCT 38.5*  MCV 100.8*  PLT 086    Basic Metabolic Panel:  Recent Labs Lab 06/12/17 0021  NA 140  K 3.7  CL 109  CO2 25  GLUCOSE 98  BUN 28*  CREATININE 1.41*  CALCIUM 8.1*    GFR: Estimated Creatinine Clearance: 38.5 mL/min (A) (by C-G formula based on SCr of 1.41 mg/dL (H)).  Liver Function Tests:  Recent Labs Lab 06/12/17 0021  AST 34  ALT 21  ALKPHOS 82  BILITOT 0.8  PROT 5.5*  ALBUMIN 2.9*   No results for input(s): LIPASE, AMYLASE in the last 168 hours. No results for input(s): AMMONIA in the last 168 hours.  Coagulation Profile: No  results for input(s): INR, PROTIME in the last 168 hours. No results for input(s): DDIMER in the last 72 hours.  Cardiac Enzymes: No results for input(s): CKTOTAL, CKMB, CKMBINDEX, TROPONINI in the last 168 hours.  BNP (last 3 results) No results for input(s): PROBNP in the last 8760 hours.  HbA1C: No results for input(s): HGBA1C in the last 72 hours. Lab Results  Component Value Date   HGBA1C 7.8 (H) 04/27/2016     CBG:  Recent Labs Lab 06/11/17 2358 06/12/17 0809  GLUCAP 93 142*    Lipid Profile: No results for input(s): CHOL,  HDL, LDLCALC, TRIG, CHOLHDL, LDLDIRECT in the last 72 hours.  Thyroid Function Tests: No results for input(s): TSH, T4TOTAL, FREET4, T3FREE, THYROIDAB in the last 72 hours.  Anemia Panel: No results for input(s): VITAMINB12, FOLATE, FERRITIN, TIBC, IRON, RETICCTPCT in the last 72 hours.  Urine analysis:    Component Value Date/Time   COLORURINE YELLOW 06/12/2017 0022   APPEARANCEUR CLEAR 06/12/2017 0022   LABSPEC 1.024 06/12/2017 0022   PHURINE 5.0 06/12/2017 0022   GLUCOSEU >=500 (A) 06/12/2017 0022   HGBUR MODERATE (A) 06/12/2017 0022   BILIRUBINUR NEGATIVE 06/12/2017 0022   KETONESUR 5 (A) 06/12/2017 0022   PROTEINUR >=300 (A) 06/12/2017 0022   UROBILINOGEN 0.2 01/26/2012 1405   NITRITE NEGATIVE 06/12/2017 0022   LEUKOCYTESUR NEGATIVE 06/12/2017 0022    Sepsis Labs: @LABRCNTIP (procalcitonin:4,lacticidven:4) )No results found for this or any previous visit (from the past 240 hour(s)).       Radiological Exams on Admission: Ct Head Wo Contrast  Result Date: 06/12/2017 CLINICAL DATA:  Altered level of consciousness. Elevated blood sugar levels. Patient is combative. Patient fell last week per daughter. EXAM: CT HEAD WITHOUT CONTRAST TECHNIQUE: Contiguous axial images were obtained from the base of the skull through the vertex without intravenous contrast. COMPARISON:  04/27/2016 FINDINGS: BRAIN: There is sulcal and ventricular  prominence consistent with superficial and central atrophy. No intraparenchymal hemorrhage, mass effect nor midline shift. Periventricular and subcortical white matter hypodensities consistent with chronic moderate small vessel ischemic disease is again noted. No acute large vascular territory infarcts. No abnormal extra-axial fluid collections. Basal cisterns are not effaced and midline. VASCULAR: Moderate calcific atherosclerosis of the carotid siphons. SKULL: No skull fracture. Motion related artifacts slightly limit assessment the skullbase. No significant scalp soft tissue swelling. SINUSES/ORBITS: The mastoid air-cells are clear. The included paranasal sinuses are well-aerated.The included ocular globes and orbital contents are non-suspicious. Left cataract extraction. OTHER: None. IMPRESSION: Atrophy with chronic moderate small vessel ischemia. No acute intracranial abnormality. Electronically Signed   By: Ashley Royalty M.D.   On: 06/12/2017 03:45   Dg Chest Port 1 View  Result Date: 06/12/2017 CLINICAL DATA:  83 y/o  M; bradycardia and altered mental status. EXAM: PORTABLE CHEST 1 VIEW COMPARISON:  06/03/2017 chest radiograph. FINDINGS: Stable cardiac silhouette given differences in technique. Increase interstitial markings and diffuse hazy opacification of the lungs. Elevated left hemidiaphragm. Aortic atherosclerosis with calcification. No acute osseous abnormality is evident. IMPRESSION: Interval increase interstitial markings and hazy opacification of lungs probably representing pulmonary edema. Underlying pneumonia not excluded. Stable elevated left hemidiaphragm. Electronically Signed   By: Kristine Garbe M.D.   On: 06/12/2017 00:52   Dg Chest 2 View  Result Date: 06/03/2017 CLINICAL DATA:  Cough, wheezing EXAM: CHEST  2 VIEW COMPARISON:  04/27/2016 FINDINGS: Heart is normal size. Bibasilar atelectasis. Mild peribronchial thickening. No effusions or acute bony abnormality. IMPRESSION:  Mild bronchitic changes with bibasilar atelectasis. Electronically Signed   By: Rolm Baptise M.D.   On: 06/03/2017 11:51   Dg Ribs Unilateral Left  Result Date: 06/05/2017 CLINICAL DATA:  Fall.  Left rib pain EXAM: LEFT RIBS - 2 VIEW COMPARISON:  06/03/2017 FINDINGS: No fracture or other bone lesions are seen involving the ribs. Elevated left hemidiaphragm unchanged. No effusion. IMPRESSION: Negative. Electronically Signed   By: Franchot Gallo M.D.   On: 06/05/2017 13:32   Ct Head Wo Contrast  Result Date: 06/12/2017 CLINICAL DATA:  Altered level of consciousness. Elevated blood sugar levels. Patient is combative. Patient fell last week  per daughter. EXAM: CT HEAD WITHOUT CONTRAST TECHNIQUE: Contiguous axial images were obtained from the base of the skull through the vertex without intravenous contrast. COMPARISON:  04/27/2016 FINDINGS: BRAIN: There is sulcal and ventricular prominence consistent with superficial and central atrophy. No intraparenchymal hemorrhage, mass effect nor midline shift. Periventricular and subcortical white matter hypodensities consistent with chronic moderate small vessel ischemic disease is again noted. No acute large vascular territory infarcts. No abnormal extra-axial fluid collections. Basal cisterns are not effaced and midline. VASCULAR: Moderate calcific atherosclerosis of the carotid siphons. SKULL: No skull fracture. Motion related artifacts slightly limit assessment the skullbase. No significant scalp soft tissue swelling. SINUSES/ORBITS: The mastoid air-cells are clear. The included paranasal sinuses are well-aerated.The included ocular globes and orbital contents are non-suspicious. Left cataract extraction. OTHER: None. IMPRESSION: Atrophy with chronic moderate small vessel ischemia. No acute intracranial abnormality. Electronically Signed   By: Ashley Royalty M.D.   On: 06/12/2017 03:45   Dg Chest Port 1 View  Result Date: 06/12/2017 CLINICAL DATA:  82 y/o  M;  bradycardia and altered mental status. EXAM: PORTABLE CHEST 1 VIEW COMPARISON:  06/03/2017 chest radiograph. FINDINGS: Stable cardiac silhouette given differences in technique. Increase interstitial markings and diffuse hazy opacification of the lungs. Elevated left hemidiaphragm. Aortic atherosclerosis with calcification. No acute osseous abnormality is evident. IMPRESSION: Interval increase interstitial markings and hazy opacification of lungs probably representing pulmonary edema. Underlying pneumonia not excluded. Stable elevated left hemidiaphragm. Electronically Signed   By: Kristine Garbe M.D.   On: 06/12/2017 00:52      EKG: Independently reviewed.   AV block, complete (third degree) Incomplete left bundle branch block   Assessment/Plan Principal Problem:   Heart block Active Problems:   Agitation   Complete heart block [facility])   Renal insufficiency     Plan Patient admitted for comfort care measures only Daughter agrees that patient is not a candidate for pacemaker placement Will order social work consultation for hospice home placement     DVT prophylaxis:  SCDs        consults called:  Family Communication: Admission, patients condition and plan of care including tests being ordered have been discussed with the patient  who indicates understanding and agree with the plan and Code Status  Admission status: inpatient    Disposition plan: Further plan will depend as patient's clinical course evolves and further radiologic and laboratory data become available. Likely home when stable   At the time of admission, it appears that the appropriate admission status for this patient is INPATIENT .Thisis judged to be reasonable and necessary in order to provide the required intensity of service to ensure the patient's safetygiven thepresenting symptoms, physical exam findings, and initial radiographic and laboratory data in the context of their chronic  comorbidities.   Reyne Dumas MD [facility] Pager [phone]  If 7PM-7AM, please contact night-coverage [email or web address] Password [facility]  06/12/2017, 8:32 AM

## 2017-06-12 NOTE — Progress Notes (Signed)
Spoke with patients granddaughter Janet Decesare concerning patients wish to go home instead of to Residential hospice. Called Palliative medicine team at [phone] and spoke to RN, advised to order Care Mgt consult for Home with Hospice for tomorrow. Forward information to granddaughter.

## 2017-06-12 NOTE — Progress Notes (Signed)
Patient admitted to 1333 from ER.  Hospitalist was notified per er dr.  No orders entered on patient yet.

## 2017-06-13 DIAGNOSIS — I459 Conduction disorder, unspecified: Secondary | ICD-10-CM

## 2017-06-13 LAB — GLUCOSE, CAPILLARY
GLUCOSE-CAPILLARY: 192 mg/dL — AB (ref 65–99)
GLUCOSE-CAPILLARY: 292 mg/dL — AB (ref 65–99)
Glucose-Capillary: 242 mg/dL — ABNORMAL HIGH (ref 65–99)
Glucose-Capillary: 280 mg/dL — ABNORMAL HIGH (ref 65–99)

## 2017-06-13 MED ORDER — LORAZEPAM 2 MG/ML PO CONC: 1.0000 mg | ORAL | 0 refills | Status: AC | PRN

## 2017-06-13 MED ORDER — MORPHINE SULFATE (CONCENTRATE) 10 MG/0.5ML PO SOLN: 5.0000 mg | ORAL | 0 refills | Status: AC | PRN

## 2017-06-13 MED ORDER — INSULIN DETEMIR 100 UNIT/ML FLEXPEN: 20.0000 [IU] | PEN_INJECTOR | Freq: Every day | SUBCUTANEOUS | 0 refills | Status: AC

## 2017-06-13 MED ORDER — INSULIN DETEMIR 100 UNIT/ML FLEXPEN: 15.0000 [IU] | PEN_INJECTOR | Freq: Every day | SUBCUTANEOUS | 0 refills | Status: DC

## 2017-06-13 MED ORDER — MAGNESIUM HYDROXIDE 400 MG/5ML PO SUSP
15.0000 mL | Freq: Once | ORAL | Status: AC
  Administered 2017-06-13: 15 mL via ORAL
  Filled 2017-06-13: qty 30

## 2017-06-13 MED ORDER — INSULIN DETEMIR 100 UNIT/ML FLEXPEN
20.0000 [IU] | PEN_INJECTOR | Freq: Every day | SUBCUTANEOUS | 0 refills | Status: DC
Start: 2017-06-13 — End: 2017-06-13

## 2017-06-13 NOTE — Progress Notes (Signed)
Inpatient Diabetes Program Recommendations  AACE/ADA: New Consensus Statement on Inpatient Glycemic Control (2015)  Target Ranges:  Prepandial:   less than 140 mg/dL      Peak postprandial:   less than 180 mg/dL (1-2 hours)      Critically ill patients:  140 - 180 mg/dL   Lab Results  Component Value Date   GLUCAP 280 (H) 06/13/2017   HGBA1C 7.8 (H) 04/27/2016    Review of Glycemic Control  Diabetes history: DM2 Outpatient Diabetes medications: Levemir 20-25 units QHS, Novolin R 5-10 units tidwc Current orders for Inpatient glycemic control: None  Blood sugars > 180 mg/dL. Needs part of home insulin. HgbA1C of 7.8% acceptable.  Inpatient Diabetes Program Recommendations:    Add Levemir 12 units QHS Add Novolog 0-9 units tidwc and hs + 3 units tidwc Change diet to CHO mod med.  Will follow while inpatient.  Thank you. Lorenda Peck, RD, LDN, CDE Inpatient Diabetes Coordinator [phone]

## 2017-06-13 NOTE — Consult Note (Signed)
[place]: Met with pt and his daughter in room [place]. Explained hospice services care at home. They do agree with hospice philosophy and are interested in the care at home. Discussed pt's case with our medical director who has agreed pt is appropriate for hospice care at home. The daughter states he needs equipment of hospital bed, and OBT. This has been ordered and will be delivered this evening. Webb Silversmith RN

## 2017-06-13 NOTE — Progress Notes (Signed)
This CM met with pt and daughter Marquita Palms at bedside to clarify discharge plans. Pt has decided that he wants to try and go back home to his Independent Living at [facility] to be with his wife. Choice offered for home hospice services and [place] was chosen. Referral called to [facility]. Daughter is requesting hospital bed and over bed table for home. He will need ambulance transport home for safety per daughter. Medical Necessity form filled out and printed and yellow DNR signed and placed on chart. Marney Doctor RN,BSN,NCM [phone]

## 2017-06-13 NOTE — Progress Notes (Signed)
Palliative Medicine RN Note: Rec'd a call last night at 1900 from RN. Family has decided they no longer want residential hospice and now want to take her home. Order placed for [facility] to arrange.  Marjie Skiff Eylin Pontarelli, RN, BSN, [facility] 06/13/2017 8:51 AM Cell [phone] 8:00-4:00 Monday-Friday Office [phone]

## 2017-06-13 NOTE — Discharge Summary (Signed)
[facility] Discharge Summary   Patient: Andrew Weaver PXT:[id number]   PCP: Lajean Manes, MD DOB: [birth year]   Date of admission: 06/11/2017   Date of discharge:  06/13/2017    Discharge Diagnoses:  Principal Problem:   Heart block Active Problems:   Agitation   Complete heart block ([place])   Renal insufficiency   Admitted From: home Disposition:  Home With hospice  Recommendations for Outpatient Follow-up:  1.  Follow-up with PCP in one week.  Follow-up Information    [facility] Follow up.   Contact information: [address] [place] [address] [phone]          Diet recommendation: Carb modified diet  Activity: The patient is advised to gradually reintroduce usual activities.  Discharge Condition: fair  Code Status: DNR/DNI, home with hospice  History of present illness: As per the H and P dictated on admission, "83 year old male with a history of 2 stents in the LAD in 2007 ( PTCA in 2000], CVA, chronic kidney disease, dyslipidemia, peripheral vascular disease, from [facility] independent living presented on 9/20 because of  mechanical fall  . Apparently the patient injured his left rib cage, and since then patient has been more agitated than normal. He has been more confused. He is not eating and drinking. Patient presented again on 9/26 because of elevated sugars of 500, altered mental status, combativeness and confusion. Patient had to be sedated with Versed. Patient found to have a heart rate of 38 -40. Patient found to be in  Severe bradycardia. ECG shows third-degree AV block.  He is not on any beta blockers or calcium channel blockers to explain this. Daughter confirms that he would not want pacemaker insertion. Patient did receive atropine without results. CT head was without any intracranial abnormality. Patient admitted for comfort measures only. Daughter confirmed that the patient is a DO NOT RESUSCITATE"  Hospital Course:    Summary of his active problems in the hospital is as following. 1. Complete heart block. Patient presented with complains of fall confusion, combativeness. Workup in the ER showed complete heart block. CT head unremarkable. No focal deficit on examination as well. Patient asymptomatic on my examination. It presence of complete heart block) her support prognosis on this patient without any active treatment. The family does not want any pacemaker or any other active intervention procedure. After discussion with hospice, palliative care as well as admitting provider in ER provider it was decided that patient's care will be transferred to complete comfort with home hospice at assisted living facility.  2. Type 2 diabetes mellitus. In an effort to simplify patient's regimen while he is on hospice family requested to transition him to once a day dose. With an expectation that the patient may have poor by mouth intake going forward I have decided to reduce patient's Levemir dose from 25 units to 20 units. Family requested to discontinue the short-acting insulin which I did.  3. History of COPD, CVA. Patient Plavix, now on hospice, discontinue Plavix.  All other chronic medical condition were stable during the hospitalization.  On the day of the discharge the patient's vitals were stable, and no other acute medical condition were reported by patient. the patient was felt safe to be discharge at home with hospice.  Procedures and Results:  none   Consultations:  Palliative care  DISCHARGE MEDICATION: Current Discharge Medication List    START taking these medications   Details  LORazepam (ATIVAN) 2 MG/ML concentrated solution Place 0.5  mLs (1 mg total) under the tongue every 4 (four) hours as needed for anxiety. Qty: 30 mL, Refills: 0    Morphine Sulfate (MORPHINE CONCENTRATE) 10 MG/0.5ML SOLN concentrated solution Take 0.25 mLs (5 mg total) by mouth every 2 (two) hours as needed for  moderate pain (or dyspnea). Qty: 15 mL, Refills: 0      CONTINUE these medications which have CHANGED   Details  Insulin Detemir (LEVEMIR) 100 UNIT/ML Pen Inject 15 Units into the skin daily at 10 pm. Qty: 15 mL, Refills: 0      CONTINUE these medications which have NOT CHANGED   Details  acetaminophen (TYLENOL) 500 MG tablet Take 1,000 mg by mouth every 6 (six) hours as needed for moderate pain.    polyethylene glycol powder (GLYCOLAX/MIRALAX) powder Take 17 g by mouth daily as needed (constipation). CONSTIPATION    nitroGLYCERIN (NITROSTAT) 0.4 MG SL tablet Place 1 tablet (0.4 mg total) under the tongue every 5 (five) minutes as needed for chest pain (3 DOSES MAX). Qty: 30 tablet, Refills: 1    ONE TOUCH ULTRA TEST test strip TEST TID UTD Refills: 2      STOP taking these medications     clopidogrel (PLAVIX) 75 MG tablet      insulin regular (NOVOLIN R,HUMULIN R) 100 units/mL injection      losartan (COZAAR) 100 MG tablet        Allergies  Allergen Reactions  . Lipitor [Atorvastatin] Other (See Comments)    Fatigue - pt tolerates 10 mg dose   Discharge Instructions    Diet general    Complete by:  As directed    Driving Restrictions    Complete by:  As directed    DO NOT DRIVE.   Increase activity slowly    Complete by:  As directed      Discharge Exam: Filed Weights   06/12/17 0623  Weight: 90 kg (198 lb 6.6 oz)   Vitals:   06/12/17 2100 06/13/17 0502  BP: (!) 141/81 (!) 159/56  Pulse: (!) 51 (!) 41  Resp: 18 16  Temp: 97.9 F (36.6 C) 98.1 F (36.7 C)  SpO2: 97% 93%   General: Appear in mild distress, no Rash; Oral Mucosa moist. Cardiovascular: S1 and S2 Present, no Murmur, no JVD Respiratory: Bilateral Air entry present and Clear to Auscultation, no Crackles, no wheezes Abdomen: Bowel Sound present, Soft and no tenderness Extremities: no Pedal edema, no calf tenderness Neurology: Grossly no focal neuro deficit.  The results of significant  diagnostics from this hospitalization (including imaging, microbiology, ancillary and laboratory) are listed below for reference.    Significant Diagnostic Studies: Dg Chest 2 View  Result Date: 06/03/2017 CLINICAL DATA:  Cough, wheezing EXAM: CHEST  2 VIEW COMPARISON:  04/27/2016 FINDINGS: Heart is normal size. Bibasilar atelectasis. Mild peribronchial thickening. No effusions or acute bony abnormality. IMPRESSION: Mild bronchitic changes with bibasilar atelectasis. Electronically Signed   By: Rolm Baptise M.D.   On: 06/03/2017 11:51   Dg Ribs Unilateral Left  Result Date: 06/05/2017 CLINICAL DATA:  Fall.  Left rib pain EXAM: LEFT RIBS - 2 VIEW COMPARISON:  06/03/2017 FINDINGS: No fracture or other bone lesions are seen involving the ribs. Elevated left hemidiaphragm unchanged. No effusion. IMPRESSION: Negative. Electronically Signed   By: Franchot Gallo M.D.   On: 06/05/2017 13:32   Ct Head Wo Contrast  Result Date: 06/12/2017 CLINICAL DATA:  Altered level of consciousness. Elevated blood sugar levels. Patient is combative. Patient fell  last week per daughter. EXAM: CT HEAD WITHOUT CONTRAST TECHNIQUE: Contiguous axial images were obtained from the base of the skull through the vertex without intravenous contrast. COMPARISON:  04/27/2016 FINDINGS: BRAIN: There is sulcal and ventricular prominence consistent with superficial and central atrophy. No intraparenchymal hemorrhage, mass effect nor midline shift. Periventricular and subcortical white matter hypodensities consistent with chronic moderate small vessel ischemic disease is again noted. No acute large vascular territory infarcts. No abnormal extra-axial fluid collections. Basal cisterns are not effaced and midline. VASCULAR: Moderate calcific atherosclerosis of the carotid siphons. SKULL: No skull fracture. Motion related artifacts slightly limit assessment the skullbase. No significant scalp soft tissue swelling. SINUSES/ORBITS: The mastoid  air-cells are clear. The included paranasal sinuses are well-aerated.The included ocular globes and orbital contents are non-suspicious. Left cataract extraction. OTHER: None. IMPRESSION: Atrophy with chronic moderate small vessel ischemia. No acute intracranial abnormality. Electronically Signed   By: Ashley Royalty M.D.   On: 06/12/2017 03:45   Dg Chest Port 1 View  Result Date: 06/12/2017 CLINICAL DATA:  83 y/o  M; bradycardia and altered mental status. EXAM: PORTABLE CHEST 1 VIEW COMPARISON:  06/03/2017 chest radiograph. FINDINGS: Stable cardiac silhouette given differences in technique. Increase interstitial markings and diffuse hazy opacification of the lungs. Elevated left hemidiaphragm. Aortic atherosclerosis with calcification. No acute osseous abnormality is evident. IMPRESSION: Interval increase interstitial markings and hazy opacification of lungs probably representing pulmonary edema. Underlying pneumonia not excluded. Stable elevated left hemidiaphragm. Electronically Signed   By: Kristine Garbe M.D.   On: 06/12/2017 00:52    Microbiology: No results found for this or any previous visit (from the past 240 hour(s)).   Labs: CBC:  Recent Labs Lab 06/12/17 0021  WBC 7.8  NEUTROABS 6.3  HGB 12.9*  HCT 38.5*  MCV 100.8*  PLT 845   Basic Metabolic Panel:  Recent Labs Lab 06/12/17 0021  NA 140  K 3.7  CL 109  CO2 25  GLUCOSE 98  BUN 28*  CREATININE 1.41*  CALCIUM 8.1*   Liver Function Tests:  Recent Labs Lab 06/12/17 0021  AST 34  ALT 21  ALKPHOS 82  BILITOT 0.8  PROT 5.5*  ALBUMIN 2.9*   No results for input(s): LIPASE, AMYLASE in the last 168 hours. No results for input(s): AMMONIA in the last 168 hours. Cardiac Enzymes: No results for input(s): CKTOTAL, CKMB, CKMBINDEX, TROPONINI in the last 168 hours. BNP (last 3 results) No results for input(s): BNP in the last 8760 hours. CBG:  Recent Labs Lab 06/12/17 2010 06/13/17 0112 06/13/17 0423  06/13/17 0719 06/13/17 1110  GLUCAP 162* 192* 292* 242* 280*   Time spent: 35 minutes  Signed:  Elliona Doddridge  [facility]  06/13/2017  , 3:05 PM

## 2017-06-14 DIAGNOSIS — I739 Peripheral vascular disease, unspecified: Secondary | ICD-10-CM | POA: Diagnosis not present

## 2017-06-14 DIAGNOSIS — I482 Chronic atrial fibrillation: Secondary | ICD-10-CM | POA: Diagnosis not present

## 2017-06-14 DIAGNOSIS — I442 Atrioventricular block, complete: Secondary | ICD-10-CM | POA: Diagnosis not present

## 2017-06-14 DIAGNOSIS — E119 Type 2 diabetes mellitus without complications: Secondary | ICD-10-CM | POA: Diagnosis not present

## 2017-06-14 DIAGNOSIS — I129 Hypertensive chronic kidney disease with stage 1 through stage 4 chronic kidney disease, or unspecified chronic kidney disease: Secondary | ICD-10-CM | POA: Diagnosis not present

## 2017-06-14 DIAGNOSIS — N189 Chronic kidney disease, unspecified: Secondary | ICD-10-CM | POA: Diagnosis not present

## 2017-06-14 DIAGNOSIS — I2510 Atherosclerotic heart disease of native coronary artery without angina pectoris: Secondary | ICD-10-CM | POA: Diagnosis not present

## 2017-06-15 DIAGNOSIS — E119 Type 2 diabetes mellitus without complications: Secondary | ICD-10-CM | POA: Diagnosis not present

## 2017-06-15 DIAGNOSIS — I442 Atrioventricular block, complete: Secondary | ICD-10-CM | POA: Diagnosis not present

## 2017-06-15 DIAGNOSIS — I129 Hypertensive chronic kidney disease with stage 1 through stage 4 chronic kidney disease, or unspecified chronic kidney disease: Secondary | ICD-10-CM | POA: Diagnosis not present

## 2017-06-15 DIAGNOSIS — I482 Chronic atrial fibrillation: Secondary | ICD-10-CM | POA: Diagnosis not present

## 2017-06-15 DIAGNOSIS — I2510 Atherosclerotic heart disease of native coronary artery without angina pectoris: Secondary | ICD-10-CM | POA: Diagnosis not present

## 2017-06-15 DIAGNOSIS — N189 Chronic kidney disease, unspecified: Secondary | ICD-10-CM | POA: Diagnosis not present

## 2017-06-16 DIAGNOSIS — I129 Hypertensive chronic kidney disease with stage 1 through stage 4 chronic kidney disease, or unspecified chronic kidney disease: Secondary | ICD-10-CM | POA: Diagnosis not present

## 2017-06-16 DIAGNOSIS — I739 Peripheral vascular disease, unspecified: Secondary | ICD-10-CM | POA: Diagnosis not present

## 2017-06-16 DIAGNOSIS — N189 Chronic kidney disease, unspecified: Secondary | ICD-10-CM | POA: Diagnosis not present

## 2017-06-16 DIAGNOSIS — R319 Hematuria, unspecified: Secondary | ICD-10-CM | POA: Diagnosis not present

## 2017-06-16 DIAGNOSIS — N390 Urinary tract infection, site not specified: Secondary | ICD-10-CM | POA: Diagnosis not present

## 2017-06-16 DIAGNOSIS — I442 Atrioventricular block, complete: Secondary | ICD-10-CM | POA: Diagnosis not present

## 2017-06-16 DIAGNOSIS — E119 Type 2 diabetes mellitus without complications: Secondary | ICD-10-CM | POA: Diagnosis not present

## 2017-06-16 DIAGNOSIS — I2510 Atherosclerotic heart disease of native coronary artery without angina pectoris: Secondary | ICD-10-CM | POA: Diagnosis not present

## 2017-06-16 DIAGNOSIS — I482 Chronic atrial fibrillation: Secondary | ICD-10-CM | POA: Diagnosis not present

## 2017-06-16 DIAGNOSIS — Z79899 Other long term (current) drug therapy: Secondary | ICD-10-CM | POA: Diagnosis not present

## 2017-07-06 IMAGING — CT CT CERVICAL SPINE W/O CM
1 of 2 series · 8 of 14 positions shown, 10 images · non-contrast
Comparison: None.

CLINICAL DATA: Status post fall with a blow to the head today.
Initial encounter.

EXAM:
CT HEAD WITHOUT CONTRAST
CT CERVICAL SPINE WITHOUT CONTRAST
TECHNIQUE: Multidetector CT imaging of the head and cervical spine was
performed following the standard protocol without intravenous
contrast. Multiplanar CT image reconstructions of the cervical spine
were also generated.

[Series 3: head bone · axial · 0.41mm/px · z∈[-142,-20]mm · 8 of 79 slices shown, 10 images]
[im 9/79  soft-tissue]
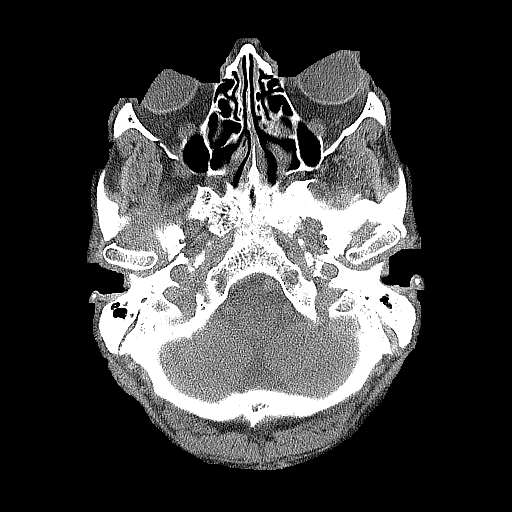
[im 9/79  bone]
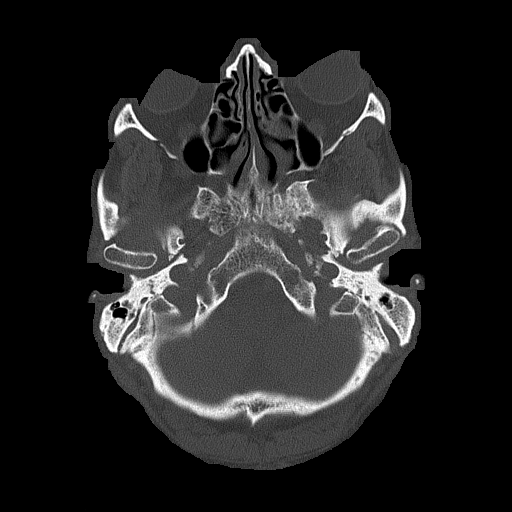
[im 18/79  bone]
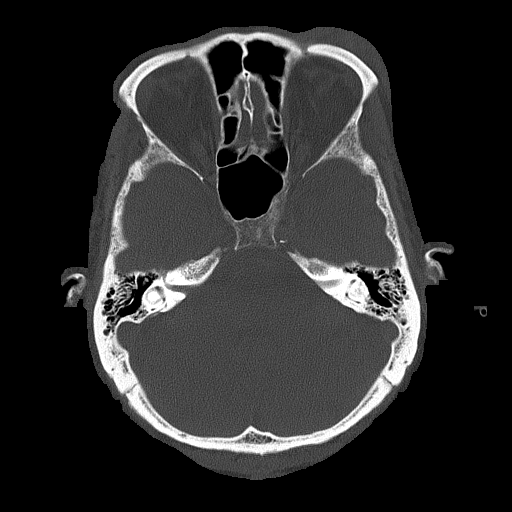
[im 27/79  bone]
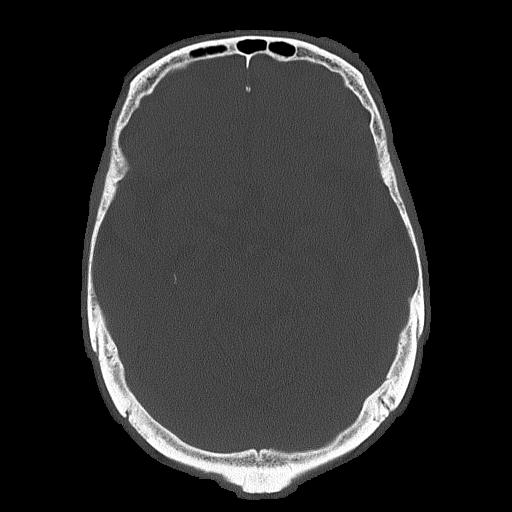
[im 35/79  bone]
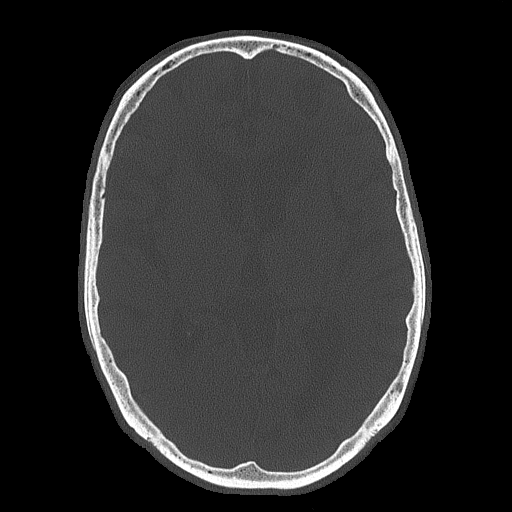
[im 44/79  soft-tissue]
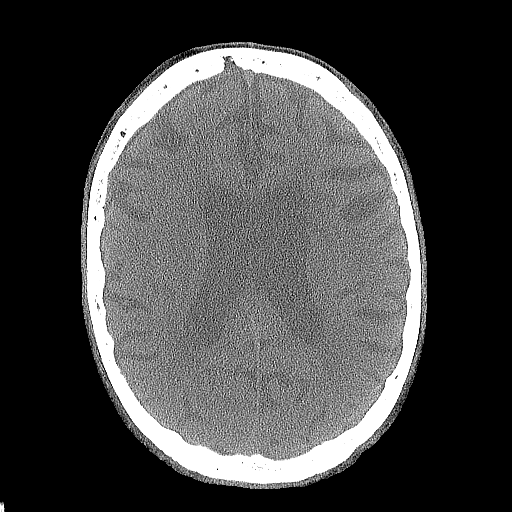
[im 44/79  bone]
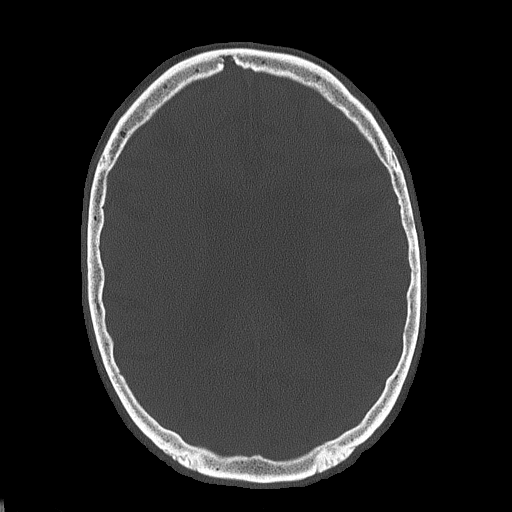
[im 53/79  bone]
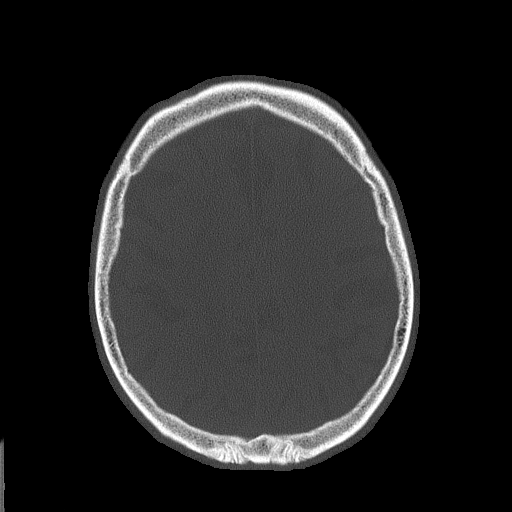
[im 61/79  bone]
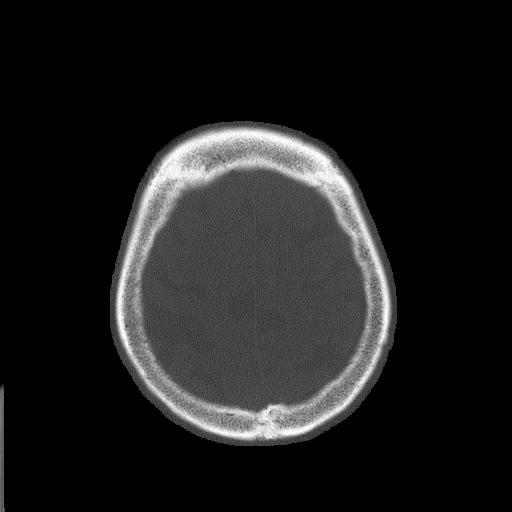
[im 70/79  bone]
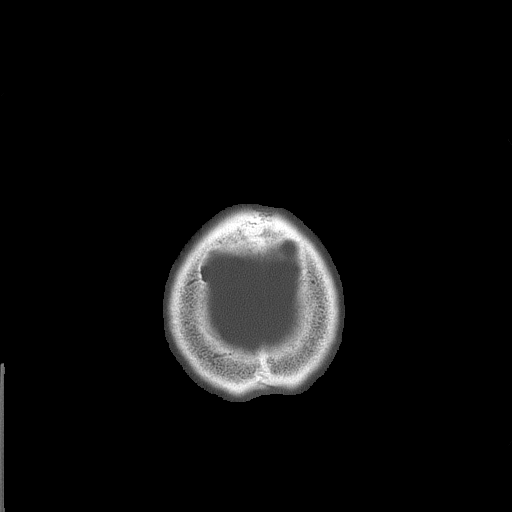

[8 of 14 positions shown; findings below may reference images not displayed]

FINDINGS: CT HEAD FINDINGS

A large hematoma is seen about the left eye without underlying
fracture. The globes are intact. Orbital fat is clear. The patient
is status post bilateral lens extraction.

There is cortical atrophy and chronic microvascular ischemic change.
No evidence of acute intracranial abnormality including hemorrhage,
infarct, mass lesion, mass effect, midline shift or abnormal
extra-axial fluid collection is identified. No hydrocephalus or
pneumocephalus. The calvarium is intact.

CT CERVICAL SPINE FINDINGS

No fracture or malalignment of the cervical spine is identified.
There is multilevel facet arthropathy. Loss of disc space height
appears worst at C5-6. Paraspinous structures are unremarkable.
IMPRESSION: Large hematoma about the left eye without underlying fracture. No
acute intracranial abnormality or acute abnormality cervical spine.

Atrophy and chronic microvascular ischemic change.

Cervical spondylosis.

## 2017-07-17 DEATH — deceased

## 2018-01-23 IMAGING — CR DG CHEST 2V
2 series · 2 of 2 positions shown · non-contrast
Comparison: 03/31/2015

CLINICAL DATA: Stroke

EXAM:
CHEST  2 VIEW

[chest lat]
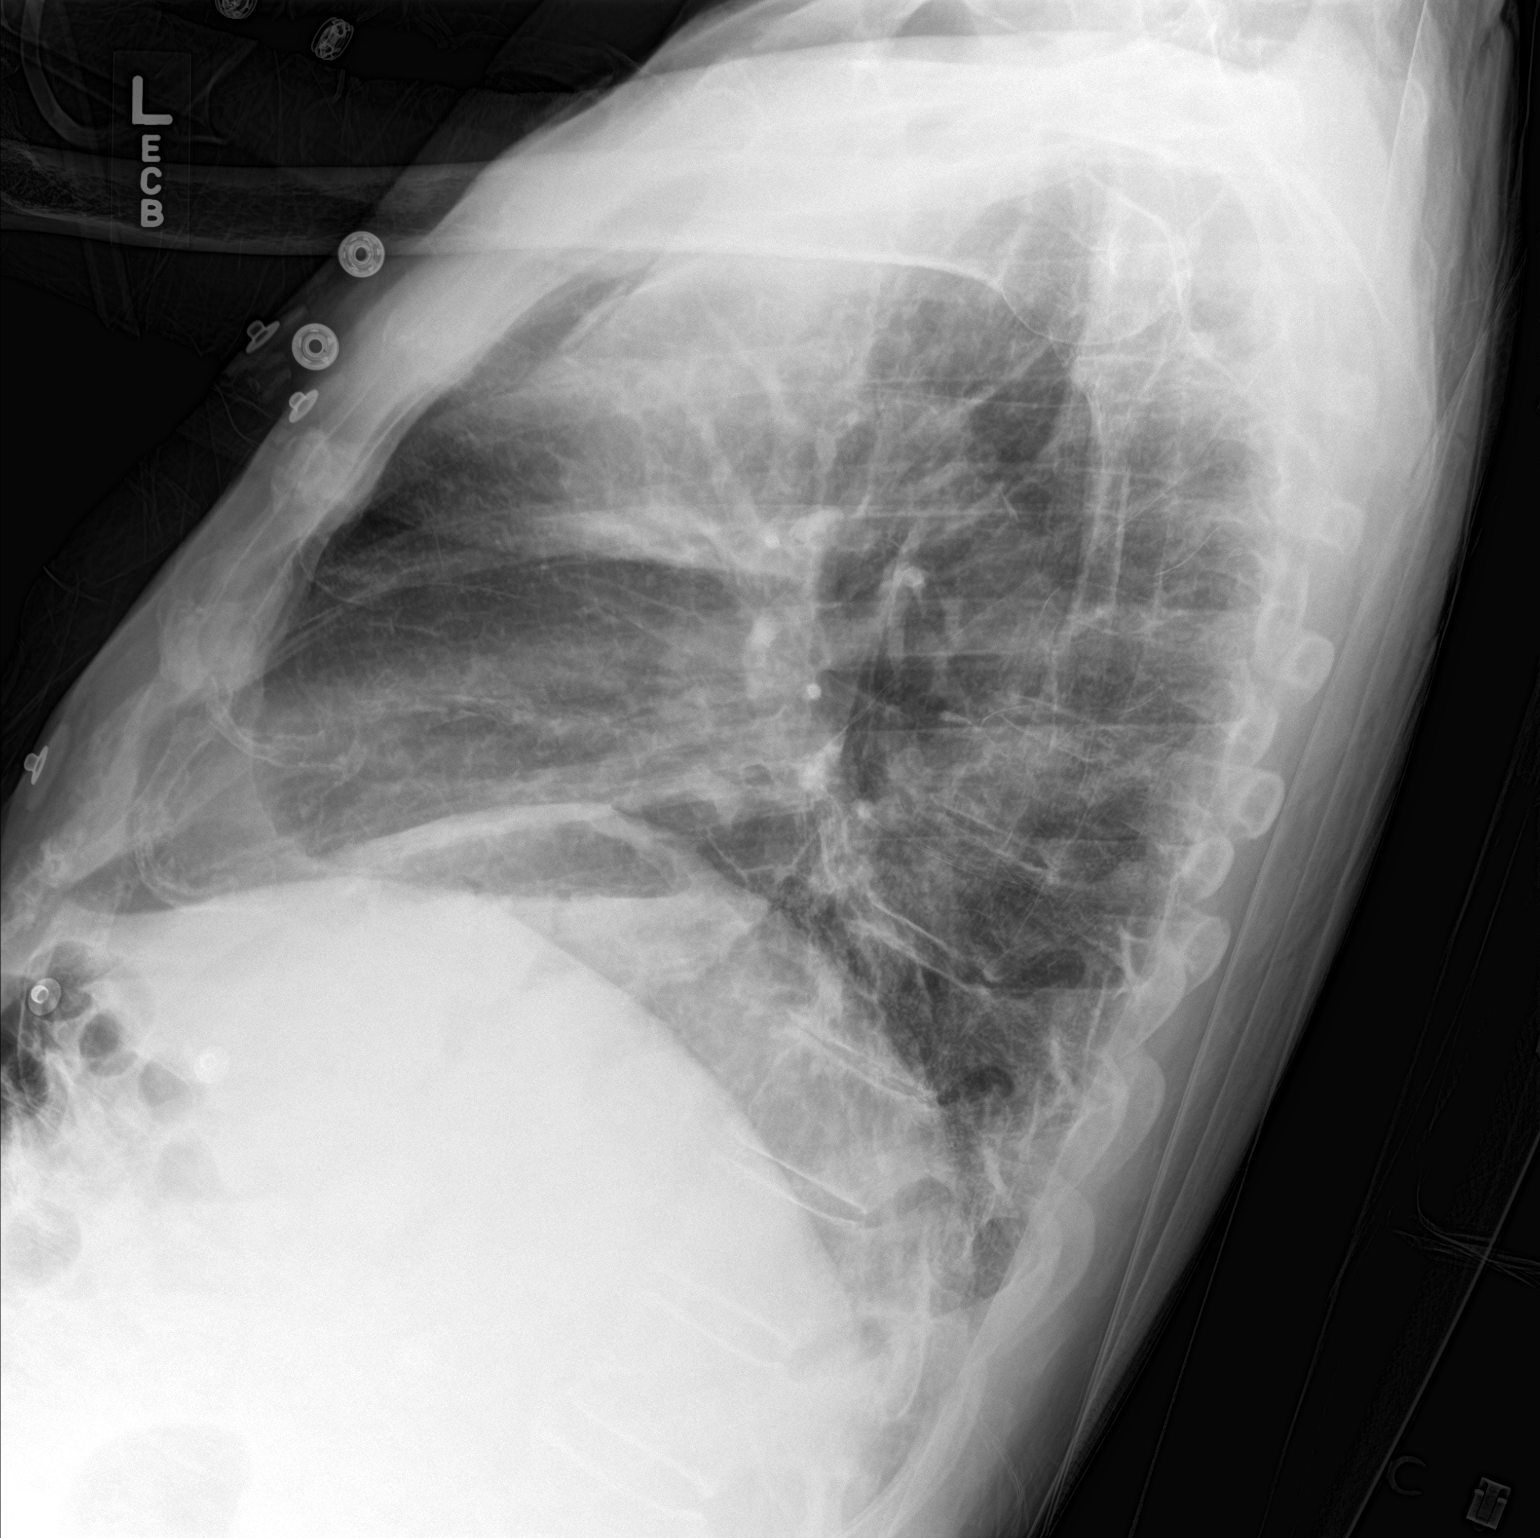

[chest ap]
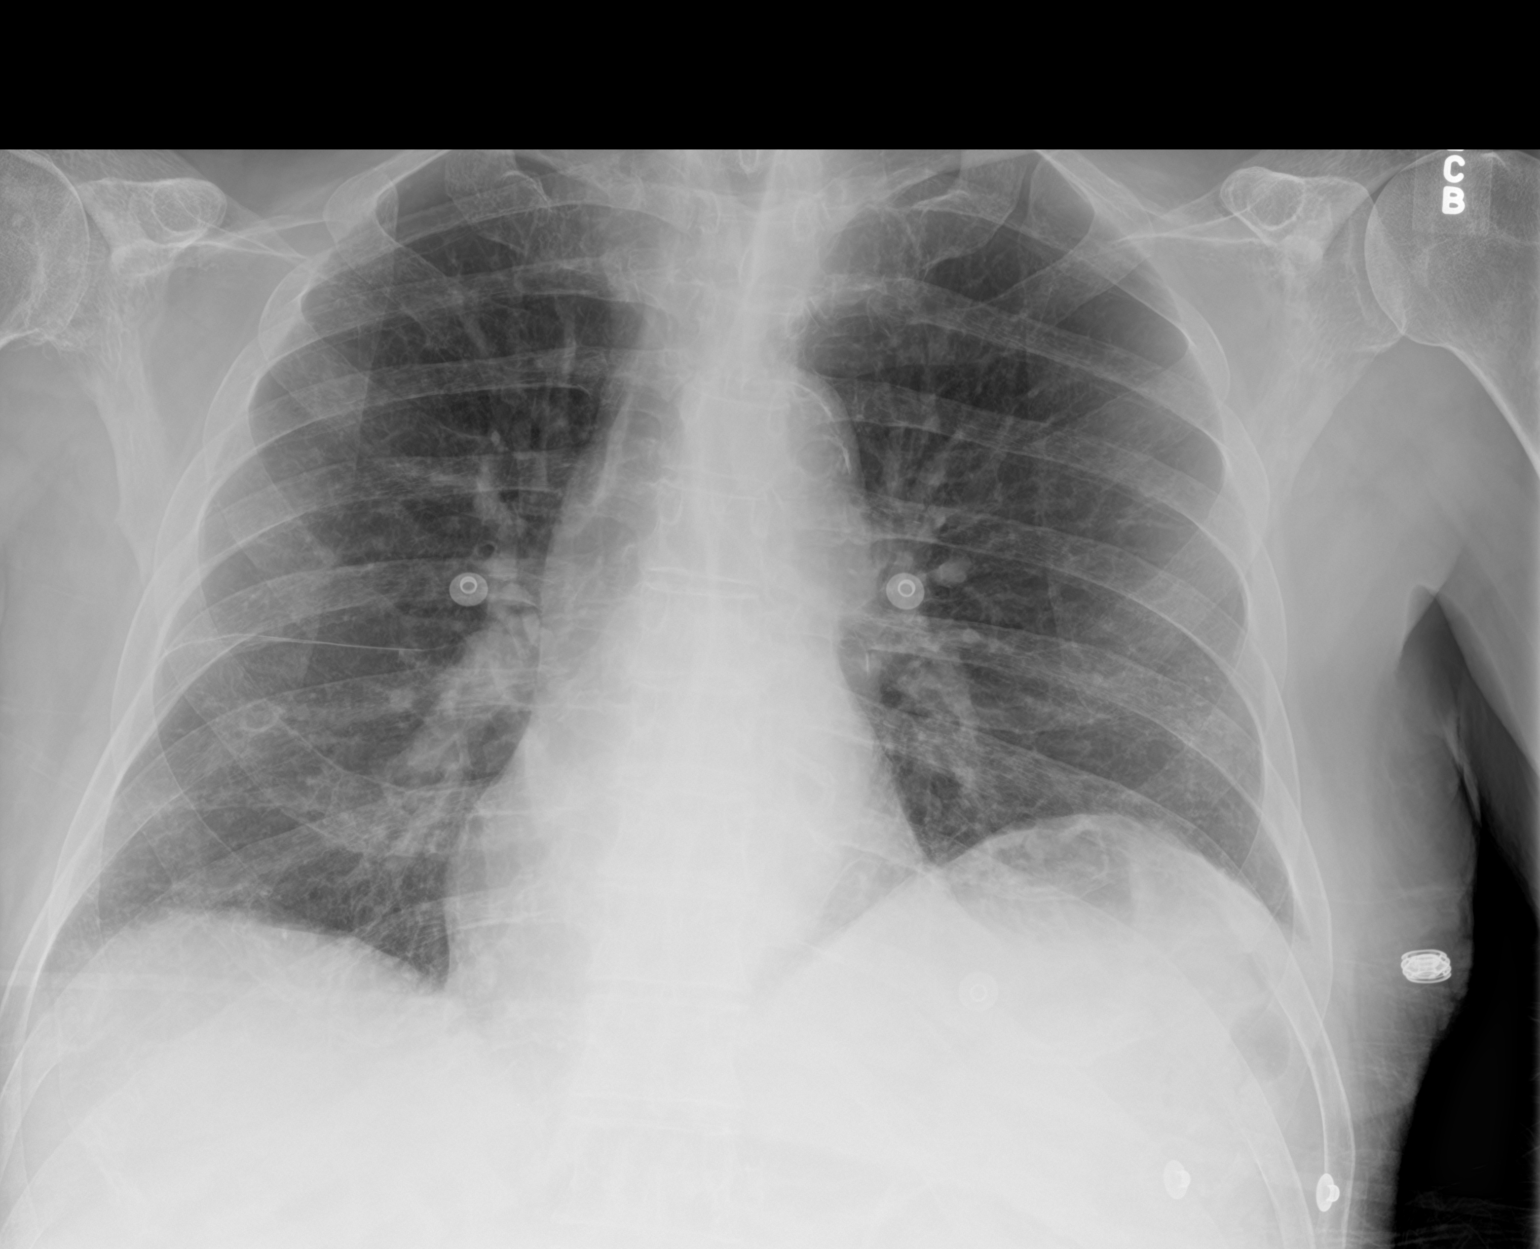

[2 of 2 positions shown; findings below may reference images not displayed]

FINDINGS: Left hemidiaphragm remains elevated. Minimal atelectasis at the left
base. Opacities at the right base have resolved. Upper lungs remain
clear. No pneumothorax. Normal heart size.
IMPRESSION: Resolved airspace disease at the right base. Minimal atelectasis at
the left base related to elevation of the left hemidiaphragm.

## 2018-02-11 ENCOUNTER — Encounter: Payer: Self-pay | Admitting: Family Medicine
